# Patient Record
Sex: Male | Born: 2005 | Race: Black or African American | Hispanic: No | Marital: Single | Smoking: Never smoker
Health system: Southern US, Community
[De-identification: ages and names within clinical notes are randomized; demographics above are authoritative.]

## PROBLEM LIST (undated history)

## (undated) DIAGNOSIS — Q059 Spina bifida, unspecified: Secondary | ICD-10-CM

## (undated) DIAGNOSIS — N390 Urinary tract infection, site not specified: Secondary | ICD-10-CM

## (undated) DIAGNOSIS — K121 Other forms of stomatitis: Secondary | ICD-10-CM

## (undated) HISTORY — PX: MYRINGOTOMY: SUR874

## (undated) HISTORY — DX: Spina bifida, unspecified: Q05.9

## (undated) HISTORY — PX: OTHER SURGICAL HISTORY: SHX169

## (undated) HISTORY — PX: HIP SURGERY: SHX245

## (undated) HISTORY — PX: FOOT SURGERY: SHX648

## (undated) HISTORY — PX: VENTRICULOPERITONEAL SHUNT: SHX204

## (undated) HISTORY — PX: SHUNT EXTERNALIZATION: SHX341

## (undated) NOTE — *Deleted (*Deleted)
49 year old PMH spina bifida, prior LE wounds (Cx MSSA+), neurogenic bladder and bowel s/p ileocystoplasty w Mitrofanoff and MACE w indwelling Chait and Bladder neck sling, prior UTI, wheelchair bound and nonambulatory at baseline presenting with concern for recurrent lower extremity infections.  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   Work-up: CBC, CMP lactate, CK, coags normal. Blood culture pending without growth   Overnight, no complaints, met with WOCN blood blister - (xerofrom gauze BID)   Vitals appropriate  Eating and drinking well. IO cath every 3 hours.    Blisters: Blistering dactylitis versus pressure ulcer  - WOCN consult  - discahrge without antibotcs   FEN/GI: - Regular diet - Nightly enema through cecostomy for neurogenic bowel             -20cc glycerin, 40cc NS mixed in syringe and given before enema             -1L NS delivered via cecostomy from gravity bag  Neurogenic bladder - In and out cath [facility] through umbilical vesicostomy/Mitrofanoff with 12Fr catheter

---
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
## 2023-01-03 ENCOUNTER — Encounter (HOSPITAL_BASED_OUTPATIENT_CLINIC_OR_DEPARTMENT_OTHER): Payer: Medicaid Other | Attending: General Surgery | Admitting: General Surgery

## 2023-01-03 DIAGNOSIS — L89314 Pressure ulcer of right buttock, stage 4: Secondary | ICD-10-CM | POA: Diagnosis present

## 2023-01-03 DIAGNOSIS — N319 Neuromuscular dysfunction of bladder, unspecified: Secondary | ICD-10-CM | POA: Insufficient documentation

## 2023-01-03 DIAGNOSIS — Q052 Lumbar spina bifida with hydrocephalus: Secondary | ICD-10-CM | POA: Diagnosis not present

## 2023-01-03 DIAGNOSIS — K592 Neurogenic bowel, not elsewhere classified: Secondary | ICD-10-CM | POA: Diagnosis not present

## 2023-01-03 DIAGNOSIS — J4520 Mild intermittent asthma, uncomplicated: Secondary | ICD-10-CM | POA: Insufficient documentation

## 2023-01-03 NOTE — Progress Notes (Signed)
CLEAVEN, REUTHER ([id number]) [id number] Page 1 of 11 Visit Report for 01/03/2023 Chief Complaint Document Details Patient Name: Date of Service: Cory Mount RIO N L. 01/03/2023 10:30 A M Medical Record Number: [id number] Patient Account Number: [id number] Date of Birth/Sex: Treating RN: 2005-08-12 (17 y.o. M) Primary Care Provider: Edwina Barth Other Clinician: Referring Provider: Treating Provider/Extender: Eilene Ghazi in Treatment: 29 Information Obtained from: Patient Chief Complaint Patient is at the clinic for treatment of an open pressure ulcer Electronic Signature(s) Signed: 01/03/2023 11:09:13 AM By: Duanne Guess MD FACS Entered By: Duanne Guess on 01/03/2023 08:09:12 -------------------------------------------------------------------------------- Debridement Details Patient Name: Date of Service: [name], Cory RIO N L. 01/03/2023 10:30 A M Medical Record Number: [id number] Patient Account Number: [id number] Date of Birth/Sex: Treating RN: [birth year] (17 y.o. [name] Primary Care Provider: Edwina Barth Other Clinician: Referring Provider: Treating Provider/Extender: [name] Weeks in Treatment: 29 Debridement Performed for Assessment: Wound #1 Right Ischium Performed By: Physician Duanne Guess, MD Debridement Type: Debridement Level of Consciousness (Pre-procedure): Awake and Alert Pre-procedure Verification/Time Out Yes - 11:00 Taken: Start Time: 11:01 Percent of Wound Bed Debrided: 100% T Area Debrided (cm): otal 10.36 Tissue and other material debrided: Non-Viable, Slough, Slough Level: Non-Viable Tissue Debridement Description: Selective/Open Wound Instrument: Curette Specimen: Tissue Culture Number of Specimens T aken: 1 Bleeding: Minimum Hemostasis Achieved: Pressure Procedural Pain: 0 Post Procedural Pain: 0 Response to Treatment: Procedure  was tolerated well Level of Consciousness (Post- Awake and Alert procedure): Post Debridement Measurements of Total Wound Length: (cm) 4 Stage: Category/Stage IV Width: (cm) 3.3 Depth: (cm) 4 Volume: (cm) 41.469 Character of Wound/Ulcer Post DebridementRAYDEL, [name] ([id number]) [id number] Page 2 of 11 Post Procedure Diagnosis Same as Pre-procedure Notes Scribed for Dr Lady Gary by Zenaida Deed, RN Electronic Signature(s) Signed: 01/03/2023 11:17:29 AM By: Duanne Guess MD FACS Signed: 01/03/2023 5:11:31 PM By: Zenaida Deed RN, BSN Entered By: Zenaida Deed on 01/03/2023 08:07:48 -------------------------------------------------------------------------------- HPI Details Patient Name: Date of Service: [name], Cory RIO N L. 01/03/2023 10:30 A M Medical Record Number: [id number] Patient Account Number: [id number] Date of Birth/Sex: Treating RN: 2006-03-26 (17 y.o. 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 Around Christmas time, he developed a pressure ulcer on his right ischium. This seems to be related to the cushioning in his wheelchair. They have an appointment coming up on Wednesday to have this checked and addressed. For some reason he has been prescribed doxycycline and Flagyl and has a couple days left of this. They have just been covering the site with gauze. On his right ischial area, he has an oval wound that extends into the fat layer. There is some slough and fibrinous exudate present on the surface. There is no erythema, induration, malodor, or purulent drainage to suggest infection. 06/21/2022: The wound measured larger and  deeper today. There is evidence of pressure induced tissue injury and the surface is a bit dry with fibrinous exudate accumulation. He does not yet have a new cushion for his wheelchair. 06/30/2022: His wound is deeper again. The surface is very clean. They did obtain the eggcrate cushion, but did not cut out an area to offload his ulcer. 07/07/2022: His wound measured a little bit smaller  today. There is slough on the wound surface. For some reason they did not get the Iodosorb gel and have been using Iodosorb pads; I do not think these are doing as good a job of chemical debridement as the gel would. They did cut out the space in his eggcrate foam cushion to accommodate his wound and I think this is helpful. 07/15/2022: His wound is deeper today. The tissue is pale, but the wound is fairly clean. For reasons that remain unclear, his wheelchair cushion has not been replaced and his caregivers have not contacted [employer] to do anything about it. 07/20/2022: His wound is a little bit smaller. There is still a lot of undermining. He has more slough accumulation today. We ordered him a Roho cushion last week, but he has not yet received it. 3/13; patient presents for follow-up. He has been using Hydrofera Blue to the right ischium wound bed. He has no issues or complaints today. 08/05/2022: The color of the tissue in the wound bed has improved markedly. It is much more beefy and red, whereas previously it has been quite pale. There is some slough on the wound surface. He finally got his custom molded wheelchair cushion for his school chair and a Roho cushion for home. There was an error on the part of the DME company for his low-air-loss mattress bed, but this is in the process of being resolved. 08/26/2022: The wound is measuring a little deeper; it looks like some fat simply separated in the center of his wound, rather than worsening of the pressure injury. The quality of the tissue continues to improve. His  low-air-loss mattress was finally delivered. He has his wound VAC with him for application today. 09/02/2022: The wound continues to worsen. I can palpate his trochanter in the deepest part of the wound, although the bone is not exposed. There is a strong odor coming from the wound and the tissue surface is gray. 09/09/2022: The progression of the wound seems to have been arrested. The color is markedly improved and is now beefy red. There are still some areas of the tissue that are more purpleish, suggesting ongoing pressure induced injury. The odor has abated. The culture that I took produced a polymicrobial population including Proteus, Pseudomonas, and multiple other species. He is currently taking levofloxacin and metronidazole. 09/16/2022: The wound continues to improve. The color and is red and the surface is appropriately moist. He has been in his Roho cushion and I do not see any areas of further pressure induced injury. He is completing his course of oral levofloxacin and Flagyl. 09/23/2022: No pressure induced tissue damage this week. There is no odor coming from the wound. No significant changes to the wound dimensions, however. 09/29/2022: The wound size remains about the same. The tissues are looking a bit healthier. There is slough on the wound surface. 10/07/2022: The wound is looking better. The undermining at 12:00 has closed down. The tunneling that extends over the trochanter is still present, but is tighter and narrower. The tissues look healthier. There is an odor, however, on the dressing. 10/14/2022: There has been very nice improvement in the wound over the past week. The tunneling of the trochanter is shorter by a good half centimeter and CODIE, FINIGAN ([id number]) [id number] Page 3 of 11 the tissue is much tighter. Near 12:00 the wound is nearly flush with the surrounding skin surface. The odor has resolved. 10/25/2022: He had his wound VAC off for  graduation and there has  been some breakdown over the trochanter. The bone is not yet exposed, but the tissue is thinner. There is a layer of slough on the wound surface. 11/03/2022: There has been nice improvement since our last visit. The tissue over the trochanter has filled again and the space is contracting. The granulation tissue appears healthy and there is no slough. No evidence of ongoing pressure-induced tissue injury. 11/09/2022: The wound continues to improve. The undermined area is filling in more, but the tissue over the trochanter is still quite thin. There is minimal biofilm on the surface and no evidence of pressure induced tissue injury today. 11/16/2022: The undermined portion of the wound has filled in even more and the tissue over the trochanter is starting to get a bit thicker. Everything is extremely clean today without any odor or slough accumulation. 11/22/2022: The undermined area of the wound continues to contract. The wound is very clean. The tissue is robust and beefy red. 12/14/2022: The undermined area has come in by almost a cm. The tissue over the trochanter feels thicker. The wound is clean without any odor. 01/03/2023: The wound is stable, but there is some purpleish discoloration directly over the ischium consistent with pressure in this area. He has a little bit of slough accumulation around the edges of the wound. There is also an odor to the wound, despite use of topical gentamicin and mupirocin. Electronic Signature(s) Signed: 01/03/2023 11:10:29 AM By: Duanne Guess MD FACS Entered By: Duanne Guess on 01/03/2023 08:10:29 -------------------------------------------------------------------------------- Physical Exam Details Patient Name: Date of Service: [name], Cory RIO N L. 01/03/2023 10:30 A M Medical Record Number: [id number] Patient Account Number: [id number] Date of Birth/Sex: Treating RN: 2005/12/25 (16 y.o. M) Primary Care Provider: Edwina Barth  Other Clinician: Referring Provider: Treating Provider/Extender: [name] Weeks in Treatment: 29 Constitutional . . . . no acute distress. Respiratory Normal work of breathing on room air. Notes 01/03/2023: The wound is stable, but there is some purpleish discoloration directly over the ischium consistent with pressure in this area. He has a little bit of slough accumulation around the edges of the wound. There is also an odor to the wound. Electronic Signature(s) Signed: 01/03/2023 11:11:02 AM By: Duanne Guess MD FACS Entered By: Duanne Guess on 01/03/2023 08:11:02 -------------------------------------------------------------------------------- Physician Orders Details Patient Name: Date of Service: [name], Cory RIO N L. 01/03/2023 10:30 A M Medical Record Number: [id number] Patient Account Number: [id number] Date of Birth/Sex: Treating RN: 03-03-2006 (16 y.o. 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 - Dr. Lady Gary RM 1 Return A Wed. 9/4 @ 10:30 am Anesthetic Wound #1 Right Ischium (In clinic) Topical Lidocaine 4% applied to wound bed Bathing/ Shower/ Hygiene May shower and wash wound with soap and water. Negative Presssure Wound Therapy Medela Wound Vac continuously at 121mm/hg Black Foam Off-Loading Low air-loss mattress (Group 2) - ordered through [employer] Roho cushion for wheelchair - sent referral to [employer] Turn and reposition every 2 hours - use arms to lift up off the wheelchair at least hourly while up in wheelchair  Additional Orders / Instructions Follow Nutritious Diet - 70- 100 gms of protein per day. add protein drink such as Premeir Protein 2 times per day. Use the Juven for added protein intake. Wound Treatment Wound #1 - Ischium Wound Laterality: Right Cleanser: Soap and Water 3 x Per Week/15 Days Discharge Instructions: May shower and wash wound with dial antibacterial soap and water prior to dressing change. Cleanser: Vashe 5.8 (oz) 3 x Per Week/15 Days Discharge Instructions: Cleanse the wound with Vashe prior to applying a clean dressing using gauze sponges, not tissue or cotton balls. Peri-Wound Care: Skin Prep 3 x Per Week/15 Days Discharge Instructions: Use skin prep as directed Topical: Gentamicin 3 x Per Week/15 Days Discharge Instructions: thin layer to wound bed mixed with mupirocin Topical: Mupirocin Ointment 3 x Per Week/15 Days Discharge Instructions: Apply Mupirocin (Bactroban) as instructed Prim Dressing: Promogran Prisma Matrix, 4.34 (sq in) (silver collagen) 3 x Per Week/15 Days ary Discharge Instructions: Moisten collagen with saline or hydrogel Prim Dressing: NPWT ary 3 x Per Week/15 Days Consults Plastic Surgery - [facility] for evaluation to flap for stage 4 pressure ulcer to right ischium Laboratory naerobe culture (MICRO) Bacteria identified in Unspecified specimen by A LOINC Code: 635-3 Convenience Name: Anaerobic culture Electronic Signature(s) Signed: 01/03/2023 11:17:29 AM By: Duanne Guess MD FACS Signed: 01/03/2023 5:11:31 PM By: Zenaida Deed RN, BSN Previous Signature: 01/03/2023 11:11:45 AM Version By: Duanne Guess MD FACS Entered By: Zenaida Deed on 01/03/2023 08:15:20 [name] ([id number]) [id number] Page 5 of 11 Prescription  01/03/2023 -------------------------------------------------------------------------------- [name] Duanne Guess MD Patient Name: Provider: Nov 12, 2005 [id number] Date of Birth: NPI#: Judie Petit [id number] Sex: DEA #: [phone] 2012 Phone #: License #: UPN: Patient Address: [address] Eligha Bridegroom [facility] Wound [place], [place] [address] [address] [place], [place] [address] [phone] Allergies latex Provider's Orders Roho cushion for wheelchair - sent referral to [employer] Hand Signature: Date(s): Prescription 01/03/2023 [name] Duanne Guess MD Patient Name: Provider: [birth year] [id number] Date of Birth: NPI#: Judie Petit [id number] Sex: DEA #: 2012 Phone #: License #: UPN: Patient Address: [address] Eligha Bridegroom [facility] Wound [place], [place] [address] [place], [place] [address] [phone] Allergies latex Provider's Orders Plastic Surgery - [facility] for evaluation to flap for stage 4 pressure ulcer to right ischium Hand Signature: Date(s): Electronic Signature(s) Signed: 01/03/2023 11:16:50 AM By: Duanne Guess MD FACS Entered By: Duanne Guess on 01/03/2023 08:16:49 -------------------------------------------------------------------------------- Problem List Details Patient Name: Date of Service: [name], Cory RIO N L. 01/03/2023 10:30 A M Medical Record Number: [id number] Patient Account Number: [id number] Date of Birth/Sex: Treating RN: [birth year] (16 y.o. 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 01/03/2023 10:30  A M Medical Record Number: [id number] Patient Account Number: [id number] Date of Birth/Sex: Treating RN: 14-Apr-2006 (17 y.o. 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 Around Christmas time, he developed a pressure ulcer on his right ischium. This seems to be related to the cushioning in his wheelchair. They have an appointment coming up on Wednesday to have this checked and addressed. For some reason he has been prescribed doxycycline and Flagyl and has a couple days left of this. They have just been covering the site with gauze. On his right ischial area, he has an oval wound that extends into the fat layer. There is some slough and fibrinous exudate present on the surface. There is no erythema, induration, malodor, or purulent drainage to suggest infection. 06/21/2022: The wound measured larger and deeper today. There is  evidence of pressure induced tissue injury and the surface is a bit dry with fibrinous exudate accumulation. He does not yet have a new cushion for his wheelchair. 06/30/2022: His wound is deeper again. The surface is very clean. They did obtain the eggcrate cushion, but did not cut out an area to offload his ulcer. 07/07/2022: His wound measured a little bit smaller today. There is slough on the wound surface. For some reason they did not get the Iodosorb gel and have been using Iodosorb pads; I do not think these are doing as good a job of chemical debridement as the gel would. They did cut out the space in his eggcrate foam cushion to accommodate his wound and I think this is helpful. [name], [name] ([id number]) [id number] Page 7 of 11 07/15/2022: His wound is deeper today. The tissue is pale, but the wound is fairly clean. For reasons that remain unclear, his wheelchair cushion has not been replaced and his caregivers have not contacted [employer] to do anything about it. 07/20/2022: His wound is a little bit smaller. There is still a lot of undermining. He has more slough accumulation today. We ordered him a Roho cushion last week, but he has not yet received it. 3/13; patient presents for follow-up. He has been using Hydrofera Blue to the right ischium wound bed. He has no issues or complaints today. 08/05/2022: The color of the tissue in the wound bed has improved markedly. It is much more beefy and red, whereas previously it has been quite pale. There is some slough on the wound surface. He finally got his custom molded wheelchair cushion for his school chair and a Roho cushion for home. There was an error on the part of the DME company for his low-air-loss mattress bed, but this is in the process of being resolved. 08/26/2022: The wound is measuring a little deeper; it looks like some fat simply separated in the center of his wound, rather than worsening of the  pressure injury. The quality of the tissue continues to improve. His low-air-loss mattress was finally delivered. He has his wound VAC with him for application today. 09/02/2022: The wound continues to worsen. I can palpate his trochanter in the deepest part of the wound, although the bone is not exposed. There is a strong odor coming from the wound and the tissue  surface is gray. 09/09/2022: The progression of the wound seems to have been arrested. The color is markedly improved and is now beefy red. There are still some areas of the tissue that are more purpleish, suggesting ongoing pressure induced injury. The odor has abated. The culture that I took produced a polymicrobial population including Proteus, Pseudomonas, and multiple other species. He is currently taking levofloxacin and metronidazole. 09/16/2022: The wound continues to improve. The color and is red and the surface is appropriately moist. He has been in his Roho cushion and I do not see any areas of further pressure induced injury. He is completing his course of oral levofloxacin and Flagyl. 09/23/2022: No pressure induced tissue damage this week. There is no odor coming from the wound. No significant changes to the wound dimensions, however. 09/29/2022: The wound size remains about the same. The tissues are looking a bit healthier. There is slough on the wound surface. 10/07/2022: The wound is looking better. The undermining at 12:00 has closed down. The tunneling that extends over the trochanter is still present, but is tighter and narrower. The tissues look healthier. There is an odor, however, on the dressing. 10/14/2022: There has been very nice improvement in the wound over the past week. The tunneling of the trochanter is shorter by a good half centimeter and the tissue is much tighter. Near 12:00 the wound is nearly flush with the surrounding skin surface. The odor has resolved. 10/25/2022: He had his wound VAC off for graduation and  there has been some breakdown over the trochanter. The bone is not yet exposed, but the tissue is thinner. There is a layer of slough on the wound surface. 11/03/2022: There has been nice improvement since our last visit. The tissue over the trochanter has filled again and the space is contracting. The granulation tissue appears healthy and there is no slough. No evidence of ongoing pressure-induced tissue injury. 11/09/2022: The wound continues to improve. The undermined area is filling in more, but the tissue over the trochanter is still quite thin. There is minimal biofilm on the surface and no evidence of pressure induced tissue injury today. 11/16/2022: The undermined portion of the wound has filled in even more and the tissue over the trochanter is starting to get a bit thicker. Everything is extremely clean today without any odor or slough accumulation. 11/22/2022: The undermined area of the wound continues to contract. The wound is very clean. The tissue is robust and beefy red. 12/14/2022: The undermined area has come in by almost a cm. The tissue over the trochanter feels thicker. The wound is clean without any odor. 01/03/2023: The wound is stable, but there is some purpleish discoloration directly over the ischium consistent with pressure in this area. He has a little bit of slough accumulation around the edges of the wound. There is also an odor to the wound, despite use of topical gentamicin and mupirocin. Patient History Information obtained from Caregiver, Chart. Family History Kidney Disease - Father, No family history of Cancer, Diabetes, Heart Disease, Hereditary Spherocytosis, Hypertension, Lung Disease, Seizures, Stroke, Thyroid Problems, Tuberculosis. Social History Never smoker, Marital Status - Single, Alcohol Use - Never, Drug Use - No History, Caffeine Use - Moderate. Medical History Eyes Denies history of Cataracts, Glaucoma, Optic Neuritis Ear/Nose/Mouth/Throat Denies  history of Chronic sinus problems/congestion, Middle ear problems Respiratory Patient has history of Asthma - mild Endocrine Denies history of Type I Diabetes, Type II Diabetes Genitourinary Denies history of End Stage Renal Disease Integumentary (  Skin) Denies history of History of Burn Neurologic Patient has history of Paraplegia Oncologic Denies history of Received Chemotherapy, Received Radiation Psychiatric Denies history of Anorexia/bulimia, Confinement Anxiety Hospitalization/Surgery History - myringotomy. - hip surgery. - ventriculoperitoneal shunt. Medical A Surgical History Notes nd Ear/Nose/Mouth/Throat allergic rhinitis Gastrointestinal [name], [name] ([id number]) [id number] Page 8 of 11 ostomy, bowel program Genitourinary neurogenic bladder Integumentary (Skin) eczema Musculoskeletal contracture of left hip, spinabifida Neurologic developmental delay, loloprosencephaly, hydrocephalus Objective Constitutional no acute distress. Vitals Time Taken: 10:42 AM, Height: 60 in, Weight: 110 lbs, BMI: 21.5, Temperature: 98.4 F, Pulse: 68 bpm, Respiratory Rate: 18 breaths/min, Blood Pressure: 104/62 mmHg. Respiratory Normal work of breathing on room air. General Notes: 01/03/2023: The wound is stable, but there is some purpleish discoloration directly over the ischium consistent with pressure in this area. He has a little bit of slough accumulation around the edges of the wound. There is also an odor to the wound. Integumentary (Hair, Skin) Wound #1 status is Open. Original cause of wound was Pressure Injury. The date acquired was: 05/11/2022. The wound has been in treatment 29 weeks. The wound is located on the Right Ischium. The wound measures 4cm length x 3.3cm width x 2cm depth; 10.367cm^2 area and 20.735cm^3 volume. There is Fat Layer (Subcutaneous Tissue) exposed. There is undermining starting at 2:00 and ending at 6:00 with a maximum  distance of 3.4cm. There is a medium amount of serosanguineous drainage noted. The wound margin is well defined and not attached to the wound base. There is large (67-100%) red granulation within the wound bed. There is a small (1-33%) amount of necrotic tissue within the wound bed including Adherent Slough. The periwound skin appearance had no abnormalities noted for texture. The periwound skin appearance had no abnormalities noted for moisture. The periwound skin appearance had no abnormalities noted for color. Periwound temperature was noted as No Abnormality. 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 There was a Selective/Open Wound Non-Viable Tissue Debridement with a total area of 10.36 sq cm performed by Duanne Guess, MD. With the following instrument(s): Curette to remove Non-Viable tissue/material. Material removed includes [facility]. 1 specimen was taken by a Tissue Culture and sent to the lab per facility protocol. A time out was conducted at 11:00, prior to the start of the procedure. A Minimum amount of bleeding was controlled with Pressure. The procedure was tolerated well with a pain level of 0 throughout and a pain level of 0 following the procedure. Post Debridement Measurements: 4cm length x 3.3cm width x 4cm depth; 41.469cm^3 volume. Post debridement Stage noted as Category/Stage IV. Character of Wound/Ulcer Post Debridement is stable. Post procedure Diagnosis Wound #1: Same as Pre-Procedure General Notes: Scribed for Dr Lady Gary by Zenaida Deed, RN. Plan 01/03/2023: The wound is stable, but there is some purpleish discoloration directly over the ischium consistent  with pressure in this area. He has a little bit of slough accumulation around the edges of the wound. There is also an odor to the wound, despite use of topical gentamicin and mupirocin. [name], [name] ([id number]) [id number] Page 9 of 11 I used a curette to debride slough from a portion of the wound. I then took a culture, due to the increased odor. For now, while I await culture  data, we will continue the topical gentamicin and mupirocin with Prisma silver collagen over the trochanter and continue negative pressure wound therapy. He did recently see orthopedics at [facility] regarding his scoliosis. Apparently his current seating cushion bottoms out with current pressure and his ischium is in contact with the metal stays of his wheelchair. His scoliosis is also contributing to the pressure on his right ischium and the orthopedic surgeon suggested the possibility of plastic surgery intervention for tissue coverage, as they are considering surgical intervention for the scoliosis, but cannot do so until the wound heals. We will make a referral to the [facility] plastic surgery group for them to further discuss this. Follow-up here in 2 weeks. Electronic Signature(s) Signed: 01/03/2023 11:16:09 AM By: Duanne Guess MD FACS Entered By: Duanne Guess on 01/03/2023 08:16:08 -------------------------------------------------------------------------------- HxROS Details Patient Name: Date of Service: [name], Cory RIO N L. 01/03/2023 10:30 A M Medical Record Number: [id number] Patient Account Number: [id number] Date of Birth/Sex: Treating RN: [birth year] (16 y.o. M) Primary Care Provider: Edwina Barth Other Clinician: Referring Provider: Treating Provider/Extender: Eilene Ghazi in Treatment: 29 Information Obtained From Caregiver Chart Eyes Medical History: Negative for: Cataracts; Glaucoma; Optic  Neuritis Ear/Nose/Mouth/Throat Medical History: Negative for: Chronic sinus problems/congestion; Middle ear problems Past Medical History Notes: allergic rhinitis Respiratory Medical History: Positive for: Asthma - mild Gastrointestinal Medical History: Past Medical History Notes: ostomy, bowel program Endocrine Medical History: Negative for: Type I Diabetes; Type II Diabetes Genitourinary Medical History: Negative for: End Stage Renal Disease Past Medical History Notes: neurogenic bladder Integumentary (Skin) Medical History: Negative for: History of Burn Past Medical History Notes: eczema [name], [name] ([id number]) [id number] Page 10 of 11 Musculoskeletal Medical History: Past Medical History Notes: contracture of left hip, spinabifida Neurologic Medical History: 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, loloprosencephaly, hydrocephalus Oncologic Medical History: Negative for: Received Chemotherapy; Received Radiation Psychiatric Medical History: Negative for: Anorexia/bulimia; Confinement Anxiety Immunizations Pneumococcal Vaccine: Received Pneumococcal Vaccination: No Implantable Devices No devices added Hospitalization / Surgery History Type of Hospitalization/Surgery myringotomy hip surgery ventriculoperitoneal shunt Family and Social History Cancer: No; Diabetes: No; Heart Disease: No; Hereditary Spherocytosis: No; Hypertension: No; Kidney Disease: Yes - Father; Lung Disease: No; Seizures: No; Stroke: No; Thyroid Problems: No; Tuberculosis: No; Never smoker; Marital Status - Single; Alcohol Use: Never; Drug Use: No History; Caffeine Use: Moderate; Financial Concerns: No; Food, Clothing or Shelter Needs: No; Support System Lacking: No; Transportation Concerns: No Electronic Signature(s) Signed: 01/03/2023 11:17:29 AM By: Duanne Guess MD FACS Entered By: Duanne Guess on 01/03/2023  08:10:34 -------------------------------------------------------------------------------- SuperBill Details Patient Name: Date of Service: [name], Cory RIO N L. 01/03/2023 Medical Record Number: [id number] Patient Account Number: [id number] Date of Birth/Sex: Treating RN: [birth year] (16 y.o. M) Primary Care Provider: Edwina Barth Other Clinician: Referring Provider: Treating Provider/Extender: [name] Weeks in Treatment: 29 Diagnosis Coding ICD-10 Codes Code Description L89.314 Pressure ulcer of right buttock, stage 4 Q05.2 Lumbar spina bifida with hydrocephalus K59.2 Neurogenic bowel, not elsewhere classified N31.9 Neuromuscular dysfunction of bladder, unspecified J45.20 Mild intermittent asthma, uncomplicated Facility Procedures [name], [name] ([id number]): CPT4 Code Description [id number] [phone] - DEBRIDE WOUND 1ST 20 SQ CM OR < ICD-10 Diagnosis Description L89.314 Pressure ulcer of right buttock, stage 4 [id number] Page 11 of 11: Modifier Quantity 1 Physician Procedures : CPT4 Code Description Modifier [id number] - WC PHYS LEVEL 4 - EST PT 25 ICD-10 Diagnosis Description L89.314 Pressure ulcer of right  buttock, stage 4 Q05.2 Lumbar spina bifida with hydrocephalus K59.2 Neurogenic bowel, not elsewhere classified N31.9  Neuromuscular dysfunction of bladder, unspecified Quantity: 1 : [id number] - WC PHYS DEBR WO ANESTH 20 SQ CM ICD-10 Diagnosis Description L89.314 Pressure ulcer of right buttock, stage 4 Quantity: 1 Electronic Signature(s) Signed: 01/03/2023 11:16:28 AM By: Duanne Guess MD FACS Entered By: Duanne Guess on 01/03/2023 36:64:40

## 2023-01-03 NOTE — Progress Notes (Signed)
JC, [name] ([id number]) [id number] Page 1 of 8 Visit Report for 01/03/2023 Arrival Information Details Patient Name: Date of Service: Cory Mount RIO N L. 01/03/2023 10:30 A M Medical Record Number: [id number] Patient Account Number: [id number] Date of Birth/Sex: Treating RN: [name] (16 y.o. [name] Primary Care [name]: [name] Other Clinician: Referring [name]: Treating [name]/Extender: [name] in Treatment: 29 Visit Information History Since Last Visit Added or deleted any medications: No Patient Arrived: Wheel Chair Any new allergies or adverse reactions: No Arrival Time: 10:41 Had a fall or experienced change in No Accompanied By: grandmother activities of daily living that may affect Transfer Assistance: None risk of falls: Patient Identification Verified: Yes Signs or symptoms of abuse/neglect since last visito No Secondary Verification Process Completed: Yes Hospitalized since last visit: No Patient Requires Transmission-Based Precautions: No Implantable device outside of the clinic excluding No Patient Has Alerts: No cellular tissue based products placed in the center since last visit: Has Dressing in Place as Prescribed: Yes Pain Present Now: No Electronic Signature(s) Signed: 01/03/2023 5:11:31 PM By: Cory Deed RN, BSN Entered By: [name] on 01/03/2023 Stark:42:23 -------------------------------------------------------------------------------- Encounter Discharge Information Details Patient Name: Date of Service: [name], Cory RIO N L. 01/03/2023 10:30 A M Medical Record Number: [id number] Patient Account Number: [id number] Date of Birth/Sex: Treating RN: [birth year] (16 y.o. [name] Primary Care [name]: [name] Other Clinician: Referring Nika Yazzie: Treating [name]/Extender: [name] in Treatment:  29 Encounter Discharge Information Items Post Procedure Vitals Discharge Condition: Stable Temperature (F): 98.4 Ambulatory Status: Wheelchair Pulse (bpm): 68 Discharge Destination: Home Respiratory Rate (breaths/min): 18 Transportation: Private Auto Blood Pressure (mmHg): 104/62 Accompanied By: granddaughter Schedule Follow-up Appointment: Yes Clinical Summary of Care: Patient Declined Electronic Signature(s) Signed: 01/03/2023 5:11:31 PM By: Cory Deed RN, BSN Entered By: [name] on 01/03/2023 08:32:43 [name] ([id number]) [id number] Page 2 of 8 -------------------------------------------------------------------------------- Lower Extremity Assessment Details Patient Name: Date of Service: Cory Mount RIO N L. 01/03/2023 10:30 A M Medical Record Number: [id number] Patient Account Number: [id number] Date of Birth/Sex: Treating RN: [name], [name] (16 y.o. [name] Primary Care [name]: [name] Other Clinician: Referring [name]: Treating [name]/Extender: [name] Weeks in Treatment: 29 Electronic Signature(s) Signed: 01/03/2023 5:11:31 PM By: Cory Deed RN, BSN Entered By: [name] on 01/03/2023 Stark:43:23 -------------------------------------------------------------------------------- Multi Wound Chart Details Patient Name: Date of Service: [name], Cory RIO N L. 01/03/2023 10:30 A M Medical Record Number: [id number] Patient Account Number: [id number] Date of Birth/Sex: Treating RN: [name], [name] (16 y.o. M) Primary Care [name]: [name] Other Clinician: Referring [name]: Treating [name]/Extender: [name] Weeks in Treatment: 29 Vital Signs Height(in): 60 Pulse(bpm): 68 Weight(lbs): 110 Blood Pressure(mmHg): 104/62 Body Mass Index(BMI): 21.5 Temperature(F): 98.4 Respiratory Rate(breaths/min): 18 [1:Photos:] [N/A:N/A] Right Ischium  N/A N/A Wound Location: Pressure Injury N/A N/A Wounding Event: Pressure Ulcer N/A N/A Primary Etiology: Asthma, Paraplegia N/A N/A Comorbid History: Stark/26/2023 N/A N/A Date Acquired: 36 N/A N/A Weeks of Treatment: Open N/A N/A Wound Status: No N/A N/A Wound Recurrence: 4x3.3x2 N/A N/A Measurements L x W x D (cm) 10.367 N/A N/A A (cm) : rea Stark.735 N/A N/A Volume (cm) : -61.Stark% N/A N/A % Reduction in A rea: -544.70% N/A N/A % Reduction in Volume: 2 Starting Position 1 (o'clock): 6 Ending Position 1 (o'clock): 3.4 Maximum Distance 1 (cm): Yes N/A N/A Undermining: Category/Stage IV N/A N/A Classification: Medium N/A N/A  Exudate A mount: Serosanguineous N/A N/A Exudate Type: red, brown N/A N/A Exudate Color: Well defined, not attached N/A N/A Wound MarginKALEEL, Stark ([id number]) [id number] Page 3 of 8 Large (67-100%) N/A N/A Granulation Amount: Red N/A N/A Granulation Quality: Small (1-33%) N/A N/A Necrotic Amount: Fat Layer (Subcutaneous Tissue): Yes N/A N/A Exposed Structures: Fascia: No Tendon: No Muscle: No Joint: No Bone: No None N/A N/A Epithelialization: Debridement - Selective/Open Wound N/A N/A Debridement: Pre-procedure Verification/Time Out 11:00 N/A N/A Taken: Slough N/A N/A Tissue Debrided: Non-Viable Tissue N/A N/A Level: 10.36 N/A N/A Debridement A (sq cm): rea Curette N/A N/A Instrument: Minimum N/A N/A Bleeding: Pressure N/A N/A Hemostasis A chieved: 0 N/A N/A Procedural Pain: 0 N/A N/A Post Procedural Pain: Procedure was tolerated well N/A N/A Debridement Treatment Response: 4x3.3x4 N/A N/A Post Debridement Measurements L x W x D (cm) 41.469 N/A N/A Post Debridement Volume: (cm) Category/Stage IV N/A N/A Post Debridement Stage: No Abnormalities Noted N/A N/A Periwound Skin Texture: No Abnormalities Noted N/A N/A Periwound Skin Moisture: No Abnormalities Noted N/A  N/A Periwound Skin Color: No Abnormality N/A N/A Temperature: Debridement N/A N/A Procedures Performed: Treatment Notes Electronic Signature(s) Signed: 01/03/2023 11:09:02 AM By: Cory Guess MD [name] Entered By: [name] on 01/03/2023 08:09:02 -------------------------------------------------------------------------------- Multi-Disciplinary Care Plan Details Patient Name: Date of Service: [name], Cory RIO N L. 01/03/2023 10:30 A M Medical Record Number: [id number] Patient Account Number: [id number] Date of Birth/Sex: Treating RN: [birth year] (16 y.o. [name] Primary Care Deloris Mittag: [name] Other Clinician: Referring Fantashia Shupert: Treating Deamber Buckhalter/Extender: [name] Weeks in Treatment: 29 Multidisciplinary Care Plan reviewed with physician Active Inactive Pressure Nursing Diagnoses: Knowledge deficit related to causes and risk factors for pressure ulcer development Knowledge deficit related to management of pressures ulcers Potential for impaired tissue integrity related to pressure, friction, moisture, and shear Goals: Patient/caregiver will verbalize understanding of pressure ulcer management Date Initiated: 06/14/2022 Target Resolution Date: 02/01/2023 Goal Status: Active Interventions: Assess: immobility, friction, shearing, incontinence upon admission and as needed Assess offloading mechanisms upon admission and as needed Assess potential for pressure ulcer upon admission and as needed Treatment Activities: [name], [name] ([id number]) [phone].pdf Page 4 of 8 Patient referred for seating evaluation to ensure proper offloading : 06/14/2022 Pressure reduction/relief device ordered : 06/14/2022 Notes: Wound/Skin Impairment Nursing Diagnoses: Impaired tissue integrity Knowledge deficit related to ulceration/compromised skin integrity Goals: Patient/caregiver will verbalize understanding of skin  care regimen Date Initiated: 06/14/2022 Target Resolution Date: 01/31/2023 Goal Status: Active Ulcer/skin breakdown will have a volume reduction of 30% by week 4 Date Initiated: 06/14/2022 Date Inactivated: 07/15/2022 Target Resolution Date: 09/14/2022 Unmet Reason: requires new w/c Goal Status: Unmet cushion Ulcer/skin breakdown will have a volume reduction of 50% by week 8 Date Initiated: 07/15/2022 Date Inactivated: 09/16/2022 Target Resolution Date: 08/12/2022 Goal Status: Unmet Unmet Reason: infection Interventions: Assess patient/caregiver ability to obtain necessary supplies Assess patient/caregiver ability to perform ulcer/skin care regimen upon admission and as needed Assess ulceration(s) every visit Provide education on ulcer and skin care Treatment Activities: Skin care regimen initiated : 06/14/2022 Topical wound management initiated : 06/14/2022 Notes: Electronic Signature(s) Signed: 01/03/2023 5:11:31 PM By: Cory Deed RN, BSN Entered By: [name] on 01/03/2023 Stark:55:08 -------------------------------------------------------------------------------- Negative Pressure Wound Therapy Maintenance (NPWT) Details Patient Name: Date of Service: Cory Mount RIO N L. 01/03/2023 10:30 A M Medical Record Number: [id number] Patient Account Number: [id number] Date of Birth/Sex: Treating RN: [name][birth year] (16 y.o. [name] Primary Care  Tsugio Elison: [name] Other Clinician: Referring Lakresha Stifter: Treating Maryam Feely/Extender: [name] in Treatment: 29 NPWT Maintenance Performed for: Wound #1 Right Ischium Performed By: Cory Deed, RN Type: Medela [employer] Coverage Size (sq cm): 13.2 Pressure Type: Constant Pressure Setting: 125 mmHG Drain Type: None Primary Contact: Other : silver collagen Sponge/Dressing Type: Foam- Black Date Initiated: 08/26/2022 Dressing Removed: Yes Quantity of Sponges/Gauze Removed: 1 Canister  Changed: Yes Canister Exudate Volume: 200 Dressing Reapplied: Yes Quantity of Sponges/Gauze Inserted: 1 Respones T Treatment: o good Days On NPWT : 131 Post Procedure Diagnosis Same as [name], [name] ([id number]) [id number] Page 5 of 8 Electronic Signature(s) Signed: 01/03/2023 5:11:31 PM By: Cory Deed RN, BSN Entered By: [name] on 01/03/2023 08:30:58 -------------------------------------------------------------------------------- Pain Assessment Details Patient Name: Date of Service: [name], Ray Church RIO N L. 01/03/2023 10:30 A M Medical Record Number: [id number] Patient Account Number: [id number] Date of Birth/Sex: Treating RN: [name] (16 y.o. [name] Primary Care Grete Bosko: [name] Other Clinician: Referring Jowanna Loeffler: Treating Kayler Buckholtz/Extender: [name] Weeks in Treatment: 29 Active Problems Location of Pain Severity and Description of Pain Patient Has Paino No Site Locations Rate the pain. Current Pain Level: 0 Pain Management and Medication Current Pain Management: Electronic Signature(s) Signed: 01/03/2023 5:11:31 PM By: Cory Deed RN, BSN Entered By: [name] on 01/03/2023 Stark:43:14 -------------------------------------------------------------------------------- Patient/Caregiver Education Details Patient Name: Date of Service: Cory Mount RIO [name] 8/19/2024andnbsp10:30 A M Medical Record Number: [id number] Patient Account Number: [id number] Date of Birth/Gender: Treating RN: [name], [name] (16 y.o. [name] Primary Care Physician: [name] Other Clinician: Referring Physician: Treating Physician/Extender: [name] in Treatment: [address] JESUS, LAPENTA ([id number]) [id number] Page 6 of 8 Education Provided To: Patient Education Topics  Provided Pressure: Methods: Explain/Verbal Responses: Reinforcements needed, State content correctly Wound/Skin Impairment: Methods: Explain/Verbal Responses: Reinforcements needed, State content correctly Electronic Signature(s) Signed: 01/03/2023 5:11:31 PM By: Cory Deed RN, BSN Entered By: [name] on 01/03/2023 Stark:55:32 -------------------------------------------------------------------------------- Wound Assessment Details Patient Name: Date of Service: [name], Cory RIO N L. 01/03/2023 10:30 A M Medical Record Number: [id number] Patient Account Number: [id number] Date of Birth/Sex: Treating RN: [name] (16 y.o. [name] Primary Care Jeane Cashatt: [name] Other Clinician: Referring Daphnee Preiss: Treating Dara Camargo/Extender: [name] Weeks in Treatment: 29 Wound Status Wound Number: 1 Primary Etiology: Pressure Ulcer Wound Location: Right Ischium Wound Status: Open Wounding Event: Pressure Injury Comorbid History: Asthma, Paraplegia Date Acquired: Stark/26/2023 Weeks Of Treatment: 29 Clustered Wound: No Photos Wound Measurements Length: (cm) 4 Width: (cm) 3.3 Depth: (cm) 2 Area: (cm) 10.367 Volume: (cm) Stark.735 % Reduction in Area: -61.2% % Reduction in Volume: -544.7% Epithelialization: None Undermining: Yes Starting Position (o'clock): 2 Ending Position (o'clock): 6 Maximum Distance: (cm) 3.4 Wound Description Classification: Category/Stage IV Wound Margin: Well defined, not attached Exudate Amount: Medium Exudate Type: Serosanguineous Exudate Color: red, brown Clerk, Haddon L ([id number]) Wound Bed Granulation Amount: Large (67-1 Granulation Quality: Red Necrotic Amount: Small (1-33 Necrotic Quality: Adherent Sl Foul Odor After Cleansing: No Slough/Fibrino No [id number] Page 7 of 8 00%) Exposed Structure Fascia Exposed: No %) Fat Layer (Subcutaneous Tissue) Exposed: Yes ough  Tendon Exposed: No Muscle Exposed: No Joint Exposed: No Bone Exposed: No Periwound Skin Texture Texture Color No Abnormalities Noted: Yes No Abnormalities Noted: Yes Moisture Temperature / Pain No Abnormalities Noted: Yes Temperature: No Abnormality Treatment Notes Wound #1 (Ischium) Wound Laterality: Right Cleanser Soap and Water  Discharge Instruction: May shower and wash wound with dial antibacterial soap and water prior to dressing change. Vashe 5.8 (oz) Discharge Instruction: Cleanse the wound with Vashe prior to applying a clean dressing using gauze sponges, not tissue or cotton balls. Peri-Wound Care Skin Prep Discharge Instruction: Use skin prep as directed Topical Gentamicin Discharge Instruction: thin layer to wound bed mixed with mupirocin Mupirocin Ointment Discharge Instruction: Apply Mupirocin (Bactroban) as instructed Primary Dressing Promogran Prisma Matrix, 4.34 (sq in) (silver collagen) Discharge Instruction: Moisten collagen with saline or hydrogel NPWT Secondary Dressing Secured With Compression Wrap Compression Stockings Add-Ons Electronic Signature(s) Signed: 01/03/2023 5:11:31 PM By: Cory Deed RN, BSN Entered By: [name] on 01/03/2023 Stark:53:56 -------------------------------------------------------------------------------- Vitals Details Patient Name: Date of Service: [name], Cory RIO N L. 01/03/2023 10:30 A M Medical Record Number: [id number] Patient Account Number: [id number] Date of Birth/Sex: Treating RN: [name] (16 y.o. [name] Primary Care Amaris Garrette: [name] Other Clinician: Referring Allena Pietila: Treating Sheritha Louis/Extender: [name] Weeks in Treatment: 29 Vital Signs Time Taken: 10:42 Temperature (F): 98.4 SARAN, SCHUENEMAN ([id number]) [id number].pdf Page 8 of 8 Height (in): 60 Pulse (bpm): 68 Weight (lbs): 110 Respiratory Rate (breaths/min): 18 Body  Mass Index (BMI): 21.5 Blood Pressure (mmHg): 104/62 Reference Range: 80 - 120 mg / dl Electronic Signature(s) Signed: 01/03/2023 5:11:31 PM By: Cory Deed RN, BSN Entered By: [name] on 01/03/2023 Stark:43:05

## 2023-01-05 ENCOUNTER — Ambulatory Visit (HOSPITAL_COMMUNITY)
Admission: RE | Admit: 2023-01-05 | Discharge: 2023-01-05 | Disposition: A | Discharge status: Home / Self Care | Payer: Medicaid Other | Source: Ambulatory Visit | Attending: General Surgery | Admitting: General Surgery

## 2023-01-05 ENCOUNTER — Other Ambulatory Visit (HOSPITAL_COMMUNITY): Payer: Self-pay | Admitting: *Deleted

## 2023-01-05 ENCOUNTER — Other Ambulatory Visit (HOSPITAL_COMMUNITY)
Admission: RE | Admit: 2023-01-05 | Discharge: 2023-01-05 | Disposition: A | Discharge status: Home / Self Care | Payer: Medicaid Other | Source: Ambulatory Visit | Attending: General Surgery | Admitting: General Surgery

## 2023-01-05 DIAGNOSIS — L89314 Pressure ulcer of right buttock, stage 4: Secondary | ICD-10-CM | POA: Diagnosis present

## 2023-01-05 LAB — ALBUMIN: Albumin: 3.8 g/dL (ref 3.5–5.0)

## 2023-01-06 ENCOUNTER — Other Ambulatory Visit (HOSPITAL_COMMUNITY): Payer: Self-pay | Admitting: General Surgery

## 2023-01-06 DIAGNOSIS — L89314 Pressure ulcer of right buttock, stage 4: Secondary | ICD-10-CM

## 2023-01-07 ENCOUNTER — Ambulatory Visit (HOSPITAL_COMMUNITY)
Admission: RE | Admit: 2023-01-07 | Discharge: 2023-01-07 | Disposition: A | Discharge status: Home / Self Care | Payer: Medicaid Other | Source: Ambulatory Visit | Attending: General Surgery | Admitting: General Surgery

## 2023-01-07 DIAGNOSIS — L89314 Pressure ulcer of right buttock, stage 4: Secondary | ICD-10-CM | POA: Insufficient documentation

## 2023-01-07 MED ORDER — GADOBUTROL 1 MMOL/ML IV SOLN
5.0000 mL | Freq: Once | INTRAVENOUS | Status: AC | PRN
  Administered 2023-01-07: 5 mL via INTRAVENOUS

## 2023-01-19 ENCOUNTER — Encounter (HOSPITAL_BASED_OUTPATIENT_CLINIC_OR_DEPARTMENT_OTHER): Payer: Medicaid Other | Attending: General Surgery | Admitting: General Surgery

## 2023-01-19 DIAGNOSIS — L89314 Pressure ulcer of right buttock, stage 4: Secondary | ICD-10-CM | POA: Insufficient documentation

## 2023-01-19 DIAGNOSIS — K592 Neurogenic bowel, not elsewhere classified: Secondary | ICD-10-CM | POA: Diagnosis not present

## 2023-01-19 DIAGNOSIS — Q052 Lumbar spina bifida with hydrocephalus: Secondary | ICD-10-CM | POA: Insufficient documentation

## 2023-01-19 DIAGNOSIS — N319 Neuromuscular dysfunction of bladder, unspecified: Secondary | ICD-10-CM | POA: Diagnosis not present

## 2023-01-19 DIAGNOSIS — M86152 Other acute osteomyelitis, left femur: Secondary | ICD-10-CM | POA: Diagnosis not present

## 2023-01-20 NOTE — Progress Notes (Signed)
NIEEM, RAYFORD ([id number]) [id number] Page 1 of 8 Visit Report for 01/19/2023 Arrival Information Details Patient Name: Date of Service: [name] 01/19/2023 10:30 A M Medical Record Number: [id number] Patient Account Number: [id number] Date of Birth/Sex: Treating RN: [birth year] (16 y.o. M) Primary Care Ellena Kamen: Edwina Barth Other Clinician: Referring Tore Carreker: Treating Alanea Woolridge/Extender: Eilene Ghazi in Treatment: 31 Visit Information History Since Last Visit All ordered tests and consults were completed: Yes Patient Arrived: Wheel Chair Added or deleted any medications: No Arrival Time: 10:55 Any new allergies or adverse reactions: No Accompanied By: grandma Had a fall or experienced change in No Transfer Assistance: None activities of daily living that may affect Patient Identification Verified: Yes risk of falls: Secondary Verification Process Completed: Yes Signs or symptoms of abuse/neglect since last visito No Patient Requires Transmission-Based Precautions: No Hospitalized since last visit: No Patient Has Alerts: No Implantable device outside of the clinic excluding No cellular tissue based products placed in the center since last visit: Has Dressing in Place as Prescribed: Yes Pain Present Now: No Electronic Signature(s) Signed: 01/19/2023 5:38:06 PM By: Zenaida Deed RN, BSN Entered By: Zenaida Deed on 01/19/2023 07:59:24 -------------------------------------------------------------------------------- Encounter Discharge Information Details Patient Name: Date of Service: [name], Cory RIO N L. 01/19/2023 10:30 A M Medical Record Number: [id number] Patient Account Number: [id number] Date of Birth/Sex: Treating RN: [birth year] (16 y.o. [name] Primary Care Aquita Simmering: Edwina Barth Other Clinician: Referring Dearion Huot: Treating Shondrika Hoque/Extender: Eilene Ghazi in Treatment: 31 Encounter Discharge Information Items Post Procedure Vitals Discharge Condition: Stable Temperature (F): 97.7 Ambulatory Status: Wheelchair Pulse (bpm): 61 Discharge Destination: Home Respiratory Rate (breaths/min): 18 Transportation: Private Auto Blood Pressure (mmHg): 109/65 Accompanied By: grandmother Schedule Follow-up Appointment: Yes Clinical Summary of Care: Patient Declined Electronic Signature(s) Signed: 01/19/2023 5:38:06 PM By: Zenaida Deed RN, BSN Entered By: Zenaida Deed on 01/19/2023 08:45:13 [name] ([id number]) [id number] Page 2 of 8 -------------------------------------------------------------------------------- Lower Extremity Assessment Details Patient Name: Date of Service: [name] 01/19/2023 10:30 A M Medical Record Number: [id number] Patient Account Number: [id number] Date of Birth/Sex: Treating RN: 2006/02/17 (16 y.o. [name] Primary Care Egon Dittus: Edwina Barth Other Clinician: Referring Kolson Chovanec: Treating Kam Kushnir/Extender: [name] Weeks in Treatment: 31 Electronic Signature(s) Signed: 01/19/2023 5:38:06 PM By: Zenaida Deed RN, BSN Entered By: Zenaida Deed on 01/19/2023 07:59:43 -------------------------------------------------------------------------------- Multi Wound Chart Details Patient Name: Date of Service: [name], Cory RIO N L. 01/19/2023 10:30 A M Medical Record Number: [id number] Patient Account Number: [id number] Date of Birth/Sex: Treating RN: 2005-09-18 (16 y.o. M) Primary Care Aleayah Chico: Edwina Barth Other Clinician: Referring Okie Jansson: Treating Ysabella Babiarz/Extender: [name] Weeks in Treatment: 31 Vital Signs Height(in): 60 Pulse(bpm): 61 Weight(lbs): 110 Blood Pressure(mmHg): 109/65 Body Mass Index(BMI): 21.5 Temperature(F): 97.7 Respiratory Rate(breaths/min): 20 [1:Photos:]  [N/A:N/A] Right Ischium N/A N/A Wound Location: Pressure Injury N/A N/A Wounding Event: Pressure Ulcer N/A N/A Primary Etiology: Asthma, Paraplegia N/A N/A Comorbid History: 05/11/2022 N/A N/A Date Acquired: 10 N/A N/A Weeks of Treatment: Open N/A N/A Wound Status: No N/A N/A Wound Recurrence: 3.5x3.7x2 N/A N/A Measurements L x W x D (cm) 10.171 N/A N/A A (cm) : rea 20.342 N/A N/A Volume (cm) : -58.10% N/A N/A % Reduction in A rea: -532.50% N/A N/A % Reduction in Volume: 1 Starting Position 1 (o'clock): 4 Ending Position 1 (o'clock): 3.6 Maximum Distance 1 (cm): Yes N/A N/A Undermining: Category/Stage IV  N/A N/A Classification: Medium N/A N/A Exudate A mount: Serosanguineous N/A N/A Exudate Type: red, brown N/A N/A Exudate Color: Well defined, not attached N/A N/A Wound MarginTALEN, LEMAR ([id number]) [id number] Page 3 of 8 Large (67-100%) N/A N/A Granulation Amount: Red N/A N/A Granulation Quality: Small (1-33%) N/A N/A Necrotic Amount: Fat Layer (Subcutaneous Tissue): Yes N/A N/A Exposed Structures: Fascia: No Tendon: No Muscle: No Joint: No Bone: No None N/A N/A Epithelialization: Debridement - Selective/Open Wound N/A N/A Debridement: Pre-procedure Verification/Time Out 11:20 N/A N/A Taken: Slough N/A N/A Tissue Debrided: Non-Viable Tissue N/A N/A Level: 10.17 N/A N/A Debridement A (sq cm): rea Curette N/A N/A Instrument: Minimum N/A N/A Bleeding: Pressure N/A N/A Hemostasis A chieved: Insensate N/A N/A Procedural Pain: Insensate N/A N/A Post Procedural Pain: Procedure was tolerated well N/A N/A Debridement Treatment Response: 3.5x3.7x2 N/A N/A Post Debridement Measurements L x W x D (cm) 20.342 N/A N/A Post Debridement Volume: (cm) Category/Stage IV N/A N/A Post Debridement Stage: No Abnormalities Noted N/A N/A Periwound Skin Texture: No Abnormalities Noted N/A N/A Periwound Skin  Moisture: No Abnormalities Noted N/A N/A Periwound Skin Color: No Abnormality N/A N/A Temperature: Debridement N/A N/A Procedures Performed: Negative Pressure Wound Therapy Maintenance (NPWT) Treatment Notes Wound #1 (Ischium) Wound Laterality: Right Cleanser Soap and Water Discharge Instruction: May shower and wash wound with dial antibacterial soap and water prior to dressing change. Vashe 5.8 (oz) Discharge Instruction: Cleanse the wound with Vashe prior to applying a clean dressing using gauze sponges, not tissue or cotton balls. Peri-Wound Care Skin Prep Discharge Instruction: Use skin prep as directed Topical Gentamicin Discharge Instruction: thin layer to wound bed mixed with mupirocin Mupirocin Ointment Discharge Instruction: Apply Mupirocin (Bactroban) as instructed Primary Dressing Promogran Prisma Matrix, 4.34 (sq in) (silver collagen) Discharge Instruction: Moisten collagen with saline or hydrogel NPWT Secondary Dressing Secured With Compression Wrap Compression Stockings Add-Ons Electronic Signature(s) Signed: 01/19/2023 12:22:38 PM By: Duanne Guess MD FACS Entered By: Duanne Guess on 01/19/2023 09:22:38 [name] ([id number]) [id number] Page 4 of 8 -------------------------------------------------------------------------------- Multi-Disciplinary Care Plan Details Patient Name: Date of Service: [name] 01/19/2023 10:30 A M Medical Record Number: [id number] Patient Account Number: [id number] Date of Birth/Sex: Treating RN: Sep 02, 2005 (16 y.o. [name] Primary Care Donne Robillard: Edwina Barth Other Clinician: Referring Duran Ohern: Treating Renan Danese/Extender: Eilene Ghazi in Treatment: 31 Multidisciplinary Care Plan reviewed with physician Active Inactive Pressure Nursing Diagnoses: Knowledge deficit related to causes and risk factors for pressure ulcer  development Knowledge deficit related to management of pressures ulcers Potential for impaired tissue integrity related to pressure, friction, moisture, and shear Goals: Patient/caregiver will verbalize understanding of pressure ulcer management Date Initiated: 06/14/2022 Target Resolution Date: 02/01/2023 Goal Status: Active Interventions: Assess: immobility, friction, shearing, incontinence upon admission and as needed Assess offloading mechanisms upon admission and as needed Assess potential for pressure ulcer upon admission and as needed Treatment Activities: Patient referred for seating evaluation to ensure proper offloading : 06/14/2022 Pressure reduction/relief device ordered : 06/14/2022 Notes: Wound/Skin Impairment Nursing Diagnoses: Impaired tissue integrity Knowledge deficit related to ulceration/compromised skin integrity Goals: Patient/caregiver will verbalize understanding of skin care regimen Date Initiated: 06/14/2022 Target Resolution Date: 01/31/2023 Goal Status: Active Ulcer/skin breakdown will have a volume reduction of 30% by week 4 Date Initiated: 06/14/2022 Date Inactivated: 07/15/2022 Target Resolution Date: 09/14/2022 Unmet Reason: requires new w/c Goal Status: Unmet cushion Ulcer/skin breakdown will have a volume reduction of 50% by week 8 Date Initiated: 07/15/2022  Date Inactivated: 09/16/2022 Target Resolution Date: 08/12/2022 Goal Status: Unmet Unmet Reason: infection Interventions: Assess patient/caregiver ability to obtain necessary supplies Assess patient/caregiver ability to perform ulcer/skin care regimen upon admission and as needed Assess ulceration(s) every visit Provide education on ulcer and skin care Treatment Activities: Skin care regimen initiated : 06/14/2022 Topical wound management initiated : 06/14/2022 Notes: Electronic Signature(s) Signed: 01/19/2023 5:38:06 PM By: Zenaida Deed RN, BSN [name], [name] ([id number])  [phone].pdf Page 5 of 8 Entered By: Zenaida Deed on 01/19/2023 08:16:00 -------------------------------------------------------------------------------- Negative Pressure Wound Therapy Maintenance (NPWT) Details Patient Name: Date of Service: Cory Mount RIO N L. 01/19/2023 10:30 A M Medical Record Number: [id number] Patient Account Number: [id number] Date of Birth/Sex: Treating RN: April 20, 2006 (16 y.o. [name] Primary Care Jaleyah Longhi: Edwina Barth Other Clinician: Referring Salathiel Ferrara: Treating Lara Palinkas/Extender: [name] Weeks in Treatment: 31 NPWT Maintenance Performed for: Wound #1 Right Ischium Performed By: Zenaida Deed, RN Coverage Size (sq cm): 12.95 Pressure Type: Constant Pressure Setting: 125 mmHG Drain Type: None Primary Contact: Silver Sponge/Dressing Type: Foam- Black Date Initiated: 08/26/2022 Dressing Removed: Yes Quantity of Sponges/Gauze Removed: 1 Canister Changed: No Dressing Reapplied: Yes Quantity of Sponges/Gauze Inserted: 1 Respones T Treatment: o good Days On NPWT : 147 Post Procedure Diagnosis Same as Pre-procedure Electronic Signature(s) Signed: 01/19/2023 5:38:06 PM By: Zenaida Deed RN, BSN Entered By: Zenaida Deed on 01/19/2023 08:24:36 -------------------------------------------------------------------------------- Pain Assessment Details Patient Name: Date of Service: [name], Cory RIO N L. 01/19/2023 10:30 A M Medical Record Number: [id number] Patient Account Number: [id number] Date of Birth/Sex: Treating RN: [birth year] (16 y.o. M) Primary Care Marcheta Horsey: Edwina Barth Other Clinician: Referring Makenize Messman: Treating Givanni Staron/Extender: Eilene Ghazi in Treatment: 31 Active Problems Location of Pain Severity and Description of Pain Patient Has Paino No Site Locations Rate the pain. CORYE, JEANNOT ([id number])  [id number] Page 6 of 8 Rate the pain. Current Pain Level: 0 Pain Management and Medication Current Pain Management: Electronic Signature(s) Signed: 01/19/2023 5:38:06 PM By: Zenaida Deed RN, BSN Entered By: Zenaida Deed on 01/19/2023 07:59:36 -------------------------------------------------------------------------------- Patient/Caregiver Education Details Patient Name: Date of Service: [name] 9/4/2024andnbsp10:30 A M Medical Record Number: [id number] Patient Account Number: [id number] Date of Birth/Gender: Treating RN: 2005-05-23 (16 y.o. [name] Primary Care Physician: Edwina Barth Other Clinician: Referring Physician: Treating Physician/Extender: Eilene Ghazi in Treatment: 78 Education Assessment Education Provided To: Patient Education Topics Provided Pressure: Methods: Explain/Verbal Responses: Reinforcements needed, State content correctly Wound/Skin Impairment: Methods: Explain/Verbal Responses: Reinforcements needed, State content correctly Electronic Signature(s) Signed: 01/19/2023 5:38:06 PM By: Zenaida Deed RN, BSN Entered By: Zenaida Deed on 01/19/2023 08:16:25 -------------------------------------------------------------------------------- Wound Assessment Details Patient Name: Date of Service: [name], Cory RIO N L. 01/19/2023 10:30 A Trula Ore, [name] ([id number]) [id number] Page 7 of 8 Medical Record Number: [id number] Patient Account Number: [id number] Date of Birth/Sex: Treating RN: 02-12-2006 (16 y.o. [name] Primary Care Amaad Byers: Edwina Barth Other Clinician: Referring Merian Wroe: Treating Valerie Fredin/Extender: [name] Weeks in Treatment: 31 Wound Status Wound Number: 1 Primary Etiology: Pressure Ulcer Wound Location: Right Ischium Wound Status: Open Wounding Event: Pressure Injury Comorbid  History: Asthma, Paraplegia Date Acquired: 05/11/2022 Weeks Of Treatment: 31 Clustered Wound: No Photos Wound Measurements Length: (cm) 3.5 Width: (cm) 3.7 Depth: (cm) 2 Area: (cm) 10.171 Volume: (cm) 20.342 % Reduction in Area: -58.1% % Reduction in Volume: -532.5% Epithelialization: None Tunneling: No Undermining: Yes Starting Position (o'clock):  1 Ending Position (o'clock): 4 Maximum Distance: (cm) 3.6 Wound Description Classification: Category/Stage IV Wound Margin: Well defined, not attached Exudate Amount: Medium Exudate Type: Serosanguineous Exudate Color: red, brown Foul Odor After Cleansing: No Slough/Fibrino No Wound Bed Granulation Amount: Large (67-100%) Exposed Structure Granulation Quality: Red Fascia Exposed: No Necrotic Amount: Small (1-33%) Fat Layer (Subcutaneous Tissue) Exposed: Yes Necrotic Quality: Adherent Slough Tendon Exposed: No Muscle Exposed: No Joint Exposed: No Bone Exposed: No Periwound Skin Texture Texture Color No Abnormalities Noted: Yes No Abnormalities Noted: Yes Moisture Temperature / Pain No Abnormalities Noted: Yes Temperature: No Abnormality Treatment Notes Wound #1 (Ischium) Wound Laterality: Right Cleanser Soap and Water Discharge Instruction: May shower and wash wound with dial antibacterial soap and water prior to dressing change. Vashe 5.8 (oz) Discharge Instruction: Cleanse the wound with Vashe prior to applying a clean dressing using gauze sponges, not tissue or cotton balls. Peri-Wound Care Skin Prep FIELDS, KAMMERZELL ([id number]) [id number].pdf Page 8 of 8 Discharge Instruction: Use skin prep as directed Topical Gentamicin Discharge Instruction: thin layer to wound bed mixed with mupirocin Mupirocin Ointment Discharge Instruction: Apply Mupirocin (Bactroban) as instructed Primary Dressing Promogran Prisma Matrix, 4.34 (sq in) (silver collagen) Discharge Instruction: Moisten collagen  with saline or hydrogel NPWT Secondary Dressing Secured With Compression Wrap Compression Stockings Add-Ons Electronic Signature(s) Signed: 01/19/2023 5:38:06 PM By: Zenaida Deed RN, BSN Entered By: Zenaida Deed on 01/19/2023 08:12:35 -------------------------------------------------------------------------------- Vitals Details Patient Name: Date of Service: [name], Cory RIO N L. 01/19/2023 10:30 A M Medical Record Number: [id number] Patient Account Number: [id number] Date of Birth/Sex: Treating RN: 2006-01-13 (16 y.o. M) Primary Care Naveah Brave: Edwina Barth Other Clinician: Referring Keison Glendinning: Treating Lynell Greenhouse/Extender: [name] Weeks in Treatment: 31 Vital Signs Time Taken: 10:56 Temperature (F): 97.7 Height (in): 60 Pulse (bpm): 61 Weight (lbs): 110 Respiratory Rate (breaths/min): 20 Body Mass Index (BMI): 21.5 Blood Pressure (mmHg): 109/65 Reference Range: 80 - 120 mg / dl Electronic Signature(s) Signed: 01/19/2023 5:38:06 PM By: Zenaida Deed RN, BSN Entered By: Zenaida Deed on 01/19/2023 07:59:30

## 2023-01-20 NOTE — Progress Notes (Signed)
Cory, GIOVINO ([id number]) [id number] Page 1 of 11 Visit Report for 01/19/2023 Chief Complaint Document Details Patient Name: Date of Service: [name]. 01/19/2023 10:30 A M Medical Record Number: [id number] Patient Account Number: [id number] Date of Birth/Sex: Treating RN: 2006/04/10 (16 y.o. M) Primary Care Provider: Edwina Barth Other Clinician: Referring Provider: Treating Provider/Extender: Eilene Ghazi in Treatment: 31 Information Obtained from: Patient Chief Complaint Patient is at the clinic for treatment of an open pressure ulcer Electronic Signature(s) Signed: 01/19/2023 12:22:50 PM By: Duanne Guess MD FACS Entered By: Duanne Guess on 01/19/2023 09:22:49 -------------------------------------------------------------------------------- Debridement Details Patient Name: Date of Service: [name], [name]. 01/19/2023 10:30 A M Medical Record Number: [id number] Patient Account Number: [id number] Date of Birth/Sex: Treating RN: 06-14-2005 (16 y.o. [name] Primary Care Provider: Edwina Barth Other Clinician: Referring Provider: Treating Provider/Extender: [name] Weeks in Treatment: 31 Debridement Performed for Assessment: Wound #1 Right Ischium Performed By: Physician Duanne Guess, MD Debridement Type: Debridement Level of Consciousness (Pre-procedure): Awake and Alert Pre-procedure Verification/Time Out Yes - 11:20 Taken: Start Time: 11:21 Percent of Wound Bed Debrided: 100% T Area Debrided (cm): otal 10.17 Tissue and other material debrided: Non-Viable, Slough, Slough Level: Non-Viable Tissue Debridement Description: Selective/Open Wound Instrument: Curette Bleeding: Minimum Hemostasis Achieved: Pressure Procedural Pain: Insensate Post Procedural Pain: Insensate Response to Treatment: Procedure was tolerated well Level of Consciousness  (Post- Awake and Alert procedure): Post Debridement Measurements of Total Wound Length: (cm) 3.5 Stage: Category/Stage IV Width: (cm) 3.7 Depth: (cm) 2 Volume: (cm) 20.342 Character of Wound/Ulcer Post Debridement: Stable Post Procedure Diagnosis Same as [name], [name] ([id number]) [id number] Page 2 of 11 Notes Scribed for Dr Lady Gary by Zenaida Deed, RN Electronic Signature(s) Signed: 01/19/2023 1:37:38 PM By: Duanne Guess MD FACS Signed: 01/19/2023 5:38:06 PM By: Zenaida Deed RN, BSN Entered By: Zenaida Deed on 01/19/2023 08:25:40 -------------------------------------------------------------------------------- HPI Details Patient Name: Date of Service: [name], [name]. 01/19/2023 10:30 A M Medical Record Number: [id number] Patient Account Number: [id number] Date of Birth/Sex: Treating RN: 12-23-2005 (16 y.o. 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 Around Christmas time, he developed a pressure ulcer on his right ischium. This seems to be related to the cushioning in his wheelchair. They have an appointment coming up on Wednesday to have this checked and addressed. For some reason he has been prescribed doxycycline and Flagyl and has a couple days left of this. They have just been covering the site with gauze. On his right ischial area, he has an oval wound that extends into the fat layer. There is some slough and fibrinous exudate present on the surface. There is no erythema, induration, malodor, or purulent drainage to suggest infection. 06/21/2022: The wound measured larger and deeper today. There is evidence of pressure  induced tissue injury and the surface is a bit dry with fibrinous exudate accumulation. He does not yet have a new cushion for his wheelchair. 06/30/2022: His wound is deeper again. The surface is very clean. They did obtain the eggcrate cushion, but did not cut out an area to offload his ulcer. 07/07/2022: His wound measured a little bit smaller today. There is slough on the wound surface. For  some reason they did not get the Iodosorb gel and have been using Iodosorb pads; I do not think these are doing as good a job of chemical debridement as the gel would. They did cut out the space in his eggcrate foam cushion to accommodate his wound and I think this is helpful. 07/15/2022: His wound is deeper today. The tissue is pale, but the wound is fairly clean. For reasons that remain unclear, his wheelchair cushion has not been replaced and his caregivers have not contacted [employer] to do anything about it. 07/20/2022: His wound is a little bit smaller. There is still a lot of undermining. He has more slough accumulation today. We ordered him a Roho cushion last week, but he has not yet received it. 3/13; patient presents for follow-up. He has been using Hydrofera Blue to the right ischium wound bed. He has no issues or complaints today. 08/05/2022: The color of the tissue in the wound bed has improved markedly. It is much more beefy and red, whereas previously it has been quite pale. There is some slough on the wound surface. He finally got his custom molded wheelchair cushion for his school chair and a Roho cushion for home. There was an error on the part of the DME company for his low-air-loss mattress bed, but this is in the process of being resolved. 08/26/2022: The wound is measuring a little deeper; it looks like some fat simply separated in the center of his wound, rather than worsening of the pressure injury. The quality of the tissue continues to improve. His low-air-loss mattress was finally delivered.  He has his wound VAC with him for application today. 09/02/2022: The wound continues to worsen. I can palpate his trochanter in the deepest part of the wound, although the bone is not exposed. There is a strong odor coming from the wound and the tissue surface is gray. 09/09/2022: The progression of the wound seems to have been arrested. The color is markedly improved and is now beefy red. There are still some areas of the tissue that are more purpleish, suggesting ongoing pressure induced injury. The odor has abated. The culture that I took produced a polymicrobial population including Proteus, Pseudomonas, and multiple other species. He is currently taking levofloxacin and metronidazole. 09/16/2022: The wound continues to improve. The color and is red and the surface is appropriately moist. He has been in his Roho cushion and I do not see any areas of further pressure induced injury. He is completing his course of oral levofloxacin and Flagyl. 09/23/2022: No pressure induced tissue damage this week. There is no odor coming from the wound. No significant changes to the wound dimensions, however. 09/29/2022: The wound size remains about the same. The tissues are looking a bit healthier. There is slough on the wound surface. 10/07/2022: The wound is looking better. The undermining at 12:00 has closed down. The tunneling that extends over the trochanter is still present, but is tighter and narrower. The tissues look healthier. There is an odor, however, on the dressing. 10/14/2022: There has been very nice improvement in the wound over the past week. The tunneling of the trochanter is shorter by a good half centimeter and the tissue is much tighter. Near 12:00 the wound is nearly flush with the surrounding skin surface. The odor has resolved. 10/25/2022: He had his wound VAC off for graduation and there has been some breakdown over the trochanter. The bone is not yet exposed, but the tissue is [name], [name]  (  [id number]) [id number] Page 3 of 11 thinner. There is a layer of slough on the wound surface. 11/03/2022: There has been nice improvement since our last visit. The tissue over the trochanter has filled again and the space is contracting. The granulation tissue appears healthy and there is no slough. No evidence of ongoing pressure-induced tissue injury. 11/09/2022: The wound continues to improve. The undermined area is filling in more, but the tissue over the trochanter is still quite thin. There is minimal biofilm on the surface and no evidence of pressure induced tissue injury today. 11/16/2022: The undermined portion of the wound has filled in even more and the tissue over the trochanter is starting to get a bit thicker. Everything is extremely clean today without any odor or slough accumulation. 11/22/2022: The undermined area of the wound continues to contract. The wound is very clean. The tissue is robust and beefy red. 12/14/2022: The undermined area has come in by almost a cm. The tissue over the trochanter feels thicker. The wound is clean without any odor. 01/03/2023: The wound is stable, but there is some purpleish discoloration directly over the ischium consistent with pressure in this area. He has a little bit of slough accumulation around the edges of the wound. There is also an odor to the wound, despite use of topical gentamicin and mupirocin. 01/19/2023: The wound has filled in and the cavity is not as deep. The area overlying the trochanter is closing in. I do not appreciate any pressure-related tissue injury. There is some slough on the surface. The odor has abated. He is currently still taking the Augmentin I prescribed in response to his culture data. The x- ray that was done was concerning for possible osteomyelitis and he has had an MRI, but this has not yet been read. His albumin is normal. The referral to plastic surgery is on hold while we complete this  testing and I suspect he will require treatment for osteomyelitis before they will agree to see him. Electronic Signature(s) Signed: 01/19/2023 12:25:04 PM By: Duanne Guess MD FACS Entered By: Duanne Guess on 01/19/2023 09:25:04 -------------------------------------------------------------------------------- Physical Exam Details Patient Name: Date of Service: [name], [name]. 01/19/2023 10:30 A M Medical Record Number: [id number] Patient Account Number: [id number] Date of Birth/Sex: Treating RN: 07-09-2005 (16 y.o. M) Primary Care Provider: Edwina Barth Other Clinician: Referring Provider: Treating Provider/Extender: [name] Weeks in Treatment: 31 Constitutional . . . . no acute distress. Respiratory Normal work of breathing on room air. Notes 01/19/2023: The wound has filled in and the cavity is not as deep. The area overlying the trochanter is closing in. I do not appreciate any pressure-related tissue injury. There is some slough on the surface. The odor has abated. Electronic Signature(s) Signed: 01/19/2023 12:26:43 PM By: Duanne Guess MD FACS Entered By: Duanne Guess on 01/19/2023 09:26:43 -------------------------------------------------------------------------------- Physician Orders Details Patient Name: Date of Service: [name], [name]. 01/19/2023 10:30 A M Medical Record Number: [id number] Patient Account Number: [id number] Date of Birth/Sex: Treating RN: [birth year] (16 y.o. 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 - Dr. Lady Gary RM 1 Return A Thurs 9/19 @ 11:15 am Anesthetic Wound #1 Right Ischium (In clinic) Topical Lidocaine 4% applied to wound bed Bathing/ Shower/ Hygiene May shower and wash wound with soap and water. Negative Presssure Wound Therapy Medela Wound Vac continuously at 124mm/hg Black Foam Off-Loading Low air-loss mattress (Group 2) - ordered through [employer] Roho cushion for wheelchair - sent referral to [employer] Turn and reposition every 2 hours - use arms to lift up off the wheelchair at least hourly while up in wheelchair Additional Orders / Instructions Follow Nutritious Diet - 70- 100 gms of protein per day. add protein drink such as Premeir Protein 2 times per day. Use the Juven for added protein intake. Wound Treatment Wound #1 - Ischium Wound Laterality: Right Cleanser: Soap and Water 3 x Per Week/15 Days Discharge Instructions: May shower and wash wound with dial antibacterial soap and water prior to dressing change. Cleanser: Vashe 5.8 (oz) 3 x Per Week/15 Days Discharge Instructions: Cleanse the wound with Vashe prior to applying a clean dressing using gauze sponges, not tissue or cotton balls. 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 Duanne Guess MD Patient Name: Provider: 24-Sep-2005 [id number] Date of Birth: NPI#: Judie Petit [id number] Sex: DEA #: [phone] 2012 Phone #: License #: UPN: Patient Address: [address] Eligha Bridegroom [facility] Wound [place], [place] [address] [address] [place], [place] [address] [phone] Allergies latex Provider's Orders Roho cushion for wheelchair - sent referral to [employer] Hand Signature: Date(s): Prescription 01/19/2023 [place] Mink Stark. Duanne Guess MD Patient Name: Provider: [birth year] [id number] Date of Birth: NPI#: Judie Petit [id number] Sex: DEA #: 2012 Phone #: License #: UPN: Patient Address: [address] Eligha Bridegroom [facility] Wound [place], [place] [address] [address] [place], [place] [address] [phone] Allergies latex Provider's Orders Infectious Disease - ICD10: L89.314 - osteomyelitis of right ischium with stage 4 pressure ulcer Hand Signature: Date(s): Electronic Signature(s) Signed: 01/19/2023 12:29:56 PM By: Duanne Guess MD FACS Entered By: Duanne Guess on 01/19/2023 09:29:56 -------------------------------------------------------------------------------- Problem List Details Patient Name: Date of Service: [name], [name]. 01/19/2023 10:30 A M Medical Record Number: [id number] Patient Account Number: [id number] Date of Birth/Sex: Treating RN: 2005/07/07 (16 y.o. 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 01/19/2023 10:30 A M Medical Record Number: [id number] Patient Account Number: [id number] Date of Birth/Sex: Treating RN: 2005/09/16 (16 y.o. 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 Around Christmas time, he developed a pressure ulcer on his right ischium. This seems to be related to the cushioning in his wheelchair. They have an appointment coming up on Wednesday to have this checked and addressed. For some reason he has been prescribed doxycycline and Flagyl and has a couple days left of this. They have just been covering the site with  gauze. On his right ischial area, he has an oval wound that extends into the fat layer. There is some slough and fibrinous exudate present on the surface. There is no erythema, induration, malodor, or purulent drainage to suggest infection. 06/21/2022: The wound measured larger and deeper today. There is evidence of pressure induced tissue injury and the surface is a bit dry with fibrinous exudate accumulation. He does not yet have a new cushion for his wheelchair. 06/30/2022: His wound is deeper again. The surface is very clean. They did obtain the eggcrate cushion, but did not cut out an area to offload his ulcer. 07/07/2022: His wound measured a little bit smaller today. There is slough on the wound surface. For some reason they did not get the Iodosorb gel and have been using Iodosorb pads; I do not think these are doing as good a job of chemical debridement as the gel would. They did cut out the space in his eggcrate foam cushion to accommodate his wound and I think this is helpful. 07/15/2022: His wound is deeper today. The tissue is pale, but the wound is fairly clean. For reasons that remain unclear, his wheelchair cushion has not been replaced and his caregivers have not contacted [employer] to do anything about it. 07/20/2022: His wound is a little bit smaller. There is still a lot of undermining. He has more slough accumulation today. We ordered him a Roho cushion last week, but he has not yet received it. 3/13; patient presents for follow-up. He has been using Hydrofera Blue to the right ischium wound bed. He has no issues or complaints today. 08/05/2022: The color of the tissue in the wound bed has improved markedly. It is much more beefy and red, whereas previously it has been quite pale. There is some slough on the wound surface. He finally got his custom molded wheelchair cushion for his school chair and a Roho cushion for home. There was an error on the part of the DME company for his  low-air-loss mattress bed, but this is in the process of being resolved. [name], [name] ([id number]) [id number] Page 7 of 11 08/26/2022: The wound is measuring a little deeper; it looks like some fat simply separated in the center of his wound, rather than worsening of  the pressure injury. The quality of the tissue continues to improve. His low-air-loss mattress was finally delivered. He has his wound VAC with him for application today. 09/02/2022: The wound continues to worsen. I can palpate his trochanter in the deepest part of the wound, although the bone is not exposed. There is a strong odor coming from the wound and the tissue surface is gray. 09/09/2022: The progression of the wound seems to have been arrested. The color is markedly improved and is now beefy red. There are still some areas of the tissue that are more purpleish, suggesting ongoing pressure induced injury. The odor has abated. The culture that I took produced a polymicrobial population including Proteus, Pseudomonas, and multiple other species. He is currently taking levofloxacin and metronidazole. 09/16/2022: The wound continues to improve. The color and is red and the surface is appropriately moist. He has been in his Roho cushion and I do not see any areas of further pressure induced injury. He is completing his course of oral levofloxacin and Flagyl. 09/23/2022: No pressure induced tissue damage this week. There is no odor coming from the wound. No significant changes to the wound dimensions, however. 09/29/2022: The wound size remains about the same. The tissues are looking a bit healthier. There is slough on the wound surface. 10/07/2022: The wound is looking better. The undermining at 12:00 has closed down. The tunneling that extends over the trochanter is still present, but is tighter and narrower. The tissues look healthier. There is an odor, however, on the dressing. 10/14/2022: There has been very  nice improvement in the wound over the past week. The tunneling of the trochanter is shorter by a good half centimeter and the tissue is much tighter. Near 12:00 the wound is nearly flush with the surrounding skin surface. The odor has resolved. 10/25/2022: He had his wound VAC off for graduation and there has been some breakdown over the trochanter. The bone is not yet exposed, but the tissue is thinner. There is a layer of slough on the wound surface. 11/03/2022: There has been nice improvement since our last visit. The tissue over the trochanter has filled again and the space is contracting. The granulation tissue appears healthy and there is no slough. No evidence of ongoing pressure-induced tissue injury. 11/09/2022: The wound continues to improve. The undermined area is filling in more, but the tissue over the trochanter is still quite thin. There is minimal biofilm on the surface and no evidence of pressure induced tissue injury today. 11/16/2022: The undermined portion of the wound has filled in even more and the tissue over the trochanter is starting to get a bit thicker. Everything is extremely clean today without any odor or slough accumulation. 11/22/2022: The undermined area of the wound continues to contract. The wound is very clean. The tissue is robust and beefy red. 12/14/2022: The undermined area has come in by almost a cm. The tissue over the trochanter feels thicker. The wound is clean without any odor. 01/03/2023: The wound is stable, but there is some purpleish discoloration directly over the ischium consistent with pressure in this area. He has a little bit of slough accumulation around the edges of the wound. There is also an odor to the wound, despite use of topical gentamicin and mupirocin. 01/19/2023: The wound has filled in and the cavity is not as deep. The area overlying the trochanter is closing in. I do not appreciate any pressure-related tissue injury. There is some slough on the  surface.  The odor has abated. He is currently still taking the Augmentin I prescribed in response to his culture data. The x- ray that was done was concerning for possible osteomyelitis and he has had an MRI, but this has not yet been read. His albumin is normal. The referral to plastic surgery is on hold while we complete this testing and I suspect he will require treatment for osteomyelitis before they will agree to see him. Patient History Information obtained from Caregiver, Chart. Family History Kidney Disease - Father, No family history of Cancer, Diabetes, Heart Disease, Hereditary Spherocytosis, Hypertension, Lung Disease, Seizures, Stroke, Thyroid Problems, Tuberculosis. Social History Never smoker, Marital Status - Single, Alcohol Use - Never, Drug Use - No History, Caffeine Use - Moderate. Medical History Eyes Denies history of Cataracts, Glaucoma, Optic Neuritis Ear/Nose/Mouth/Throat Denies history of Chronic sinus problems/congestion, Middle ear problems Respiratory Patient has history of Asthma - mild Endocrine Denies history of Type I Diabetes, Type II Diabetes Genitourinary Denies history of End Stage Renal Disease Integumentary (Skin) Denies history of History of Burn Neurologic Patient has history of Paraplegia Oncologic Denies history of Received Chemotherapy, Received Radiation Psychiatric Denies history of Anorexia/bulimia, Confinement Anxiety Hospitalization/Surgery History - myringotomy. - hip surgery. - ventriculoperitoneal shunt. Medical A Surgical History Notes nd Ear/Nose/Mouth/Throat allergic rhinitis Gastrointestinal ostomy, bowel program Genitourinary neurogenic bladder Integumentary (Skin) eczema Musculoskeletal [name], [name] ([id number]) [phone].pdf Page 8 of 11 contracture of left hip, spinabifida Neurologic developmental delay, loloprosencephaly, hydrocephalus Objective Constitutional no acute  distress. Vitals Time Taken: 10:56 AM, Height: 60 in, Weight: 110 lbs, BMI: 21.5, Temperature: 97.7 F, Pulse: 61 bpm, Respiratory Rate: 20 breaths/min, Blood Pressure: 109/65 mmHg. Respiratory Normal work of breathing on room air. General Notes: 01/19/2023: The wound has filled in and the cavity is not as deep. The area overlying the trochanter is closing in. I do not appreciate any pressure-related tissue injury. There is some slough on the surface. The odor has abated. Integumentary (Hair, Skin) Wound #1 status is Open. Original cause of wound was Pressure Injury. The date acquired was: 05/11/2022. The wound has been in treatment 31 weeks. The wound is located on the Right Ischium. The wound measures 3.5cm length x 3.7cm width x 2cm depth; 10.171cm^2 area and 20.342cm^3 volume. There is Fat Layer (Subcutaneous Tissue) exposed. There is no tunneling noted, however, there is undermining starting at 1:00 and ending at 4:00 with a maximum distance of 3.6cm. There is a medium amount of serosanguineous drainage noted. The wound margin is well defined and not attached to the wound base. There is large (67-100%) red granulation within the wound bed. There is a small (1-33%) amount of necrotic tissue within the wound bed including Adherent Slough. The periwound skin appearance had no abnormalities noted for texture. The periwound skin appearance had no abnormalities noted for moisture. The periwound skin appearance had no abnormalities noted for color. Periwound temperature was noted as No Abnormality. 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 There was a Selective/Open Wound Non-Viable Tissue  Debridement with a total area of 10.17 sq cm performed by Duanne Guess, MD. With the following instrument(s): Curette to remove Non-Viable tissue/material. Material removed includes [facility]. No specimens were taken. A time out was conducted at 11:20, prior to the start of  the procedure. A Minimum amount of bleeding was controlled with Pressure. The procedure was tolerated well with a pain level of Insensate throughout and a pain level of Insensate following the procedure. Post Debridement Measurements: 3.5cm length x 3.7cm width x 2cm depth; 20.342cm^3 volume. Post debridement Stage noted as Category/Stage IV. Character of Wound/Ulcer Post Debridement is stable. Post procedure Diagnosis Wound #1: Same as Pre-Procedure General Notes: Scribed for Dr Lady Gary by Zenaida Deed, RN. Plan Follow-up Appointments: Return Appointment in 2 weeks. - Dr. Lady Gary RM 1 Thurs 9/19 @ 11:15 am Anesthetic: Wound #1 Right Ischium: (In clinic) Topical Lidocaine 4% applied to wound bed Bathing/ Shower/ Hygiene: May shower and wash wound with soap and water. Negative Presssure Wound Therapy: Medela Wound Vac continuously at 141mm/hg Black Foam Off-Loading: CASMER, LANE ([id number]) [phone].pdf Page 9 of 11 Low air-loss mattress (Group 2) - ordered through [employer] Roho cushion for wheelchair - sent referral to [employer] Turn and reposition every 2 hours - use arms to lift up off the wheelchair at least hourly while up in wheelchair Additional Orders / Instructions: Follow Nutritious Diet - 70- 100 gms of protein per day. add protein drink such as Premeir Protein 2 times per day. Use the Juven for added protein intake. 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 Cleanser: Vashe 5.8 (oz) 3 x Per Week/15 Days Discharge Instructions: Cleanse the wound with Vashe prior to applying a clean dressing using gauze sponges, not tissue or cotton balls. Peri-Wound Care: Skin Prep 3 x Per Week/15 Days Discharge Instructions: Use skin prep as directed Topical: Gentamicin 3 x Per Week/15 Days Discharge Instructions: thin layer to wound bed mixed with mupirocin Topical: Mupirocin Ointment 3 x Per Week/15 Days Discharge Instructions: Apply Mupirocin (Bactroban) as instructed Prim Dressing: Promogran Prisma Matrix, 4.34 (sq in) (silver collagen) 3 x Per Week/15 Days ary Discharge Instructions: Moisten collagen with saline or hydrogel Prim Dressing: NPWT 3 x Per Week/15 Days ary 01/19/2023: The wound has filled in and the cavity is not as deep. The area overlying the trochanter is closing in. I do not appreciate any pressure-related tissue injury. There is some slough on the surface. The odor has abated. He is currently still taking the Augmentin I prescribed in response to his culture data. The x-ray that was done was concerning for possible osteomyelitis and he has had an MRI, but this has not yet been read. His albumin is normal. The referral to plastic surgery is on hold while we complete this testing and I suspect he will require treatment for osteomyelitis before they will agree to see him. I used a curette to debride slough from the wound surface. We will continue to use topical gentamicin and mupirocin with Prisma silver collagen on the surface with negative pressure wound therapy. He will complete the oral Augmentin. I have placed a referral to infectious disease for further evaluation and management of presumed osteomyelitis. We are also going to contact the radiology department to get a formal read of his MRI. He will follow-up in 2 weeks. Electronic Signature(s) Signed: 01/19/2023 12:28:59 PM By: Duanne Guess MD FACS Entered By: Duanne Guess on 01/19/2023 09:28:59 -------------------------------------------------------------------------------- HxROS Details Patient Name: Date of Service: [name], [name]. 01/19/2023  10:30 A M Medical Record Number: [id number] Patient Account Number: [id number] Date of Birth/Sex: Treating RN: 2005/10/24 (17 y.o. M) Primary Care Provider: Edwina Barth Other Clinician: Referring Provider: Treating Provider/Extender: Eilene Ghazi in Treatment: 31 Information Obtained From Caregiver Chart Eyes Medical History: Negative for: Cataracts; Glaucoma; Optic Neuritis Ear/Nose/Mouth/Throat Medical History: Negative for: Chronic sinus problems/congestion; Middle ear problems Past Medical History Notes: allergic rhinitis Respiratory Medical History: Positive for: Asthma - mild Gastrointestinal Medical History: Past Medical History Notes: ostomy, bowel program [name], [name] ([id number]) [id number] Page 10 of 11 Endocrine Medical History: Negative for: Type I Diabetes; Type II Diabetes Genitourinary Medical History: Negative for: End Stage Renal Disease Past Medical History Notes: neurogenic bladder Integumentary (Skin) Medical History: Negative for: History of Burn Past Medical History Notes: eczema Musculoskeletal Medical History: Past Medical History Notes: contracture of left hip, spinabifida Neurologic Medical History: 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, loloprosencephaly, hydrocephalus Oncologic Medical History: Negative for: Received Chemotherapy; Received Radiation Psychiatric Medical History: Negative for: Anorexia/bulimia; Confinement Anxiety Immunizations Pneumococcal Vaccine: Received Pneumococcal Vaccination: No Implantable Devices No devices added Hospitalization / Surgery History Type of  Hospitalization/Surgery myringotomy hip surgery ventriculoperitoneal shunt Family and Social History Cancer: No; Diabetes: No; Heart Disease: No; Hereditary Spherocytosis: No; Hypertension: No; Kidney Disease: Yes - Father; Lung Disease: No; Seizures: No; Stroke: No; Thyroid Problems: No; Tuberculosis: No; Never smoker; Marital Status - Single; Alcohol Use: Never; Drug Use: No History; Caffeine Use: Moderate; Financial Concerns: No; Food, Clothing or Shelter Needs: No; Support System Lacking: No; Transportation Concerns: No Electronic Signature(s) Signed: 01/19/2023 1:37:38 PM By: Duanne Guess MD FACS Entered By: Duanne Guess on 01/19/2023 09:26:10 -------------------------------------------------------------------------------- SuperBill Details Patient Name: Date of Service: [name], Cory RIO N Elbert Ewings 01/19/2023 [name] ([id number]) [id number] Page 11 of 11 Medical Record Number: [id number] Patient Account Number: [id number] Date of Birth/Sex: Treating RN: Dec 20, 2005 (16 y.o. M) Primary Care Provider: Edwina Barth Other Clinician: Referring Provider: Treating Provider/Extender: [name] Weeks in Treatment: 31 Diagnosis Coding ICD-10 Codes Code Description L89.314 Pressure ulcer of right buttock, stage 4 Q05.2 Lumbar spina bifida with hydrocephalus K59.2 Neurogenic bowel, not elsewhere classified N31.9 Neuromuscular dysfunction of bladder, unspecified J45.20 Mild intermittent asthma, uncomplicated Facility Procedures : CPT4 Code: 16010932 9 Description: 7597 - DEBRIDE WOUND 1ST 20 SQ CM OR < ICD-10 Diagnosis Description L89.314 Pressure ulcer of right buttock, stage 4 Modifier: Quantity: 1 : CPT4 Code: 35573220 9 Description: 7605 - WOUND VAC-50 SQ CM OR LESS Modifier: Quantity: 1 Physician Procedures : CPT4 Code Description Modifier [id number] - WC PHYS LEVEL 4 - EST PT 25 ICD-10 Diagnosis Description  L89.314 Pressure ulcer of right buttock, stage 4 Q05.2 Lumbar spina bifida with hydrocephalus K59.2 Neurogenic bowel, not elsewhere classified N31.9  Neuromuscular dysfunction of bladder, unspecified Quantity: 1 : [id number] - WC PHYS DEBR WO ANESTH 20 SQ CM ICD-10 Diagnosis Description L89.314 Pressure ulcer of right buttock, stage 4 Quantity: 1 Electronic Signature(s) Signed: 01/19/2023 12:29:24 PM By: Duanne Guess MD FACS Entered By: Duanne Guess on 01/19/2023 15:17:61

## 2023-01-24 ENCOUNTER — Other Ambulatory Visit: Payer: Self-pay | Admitting: Emergency Medicine

## 2023-01-25 MED ORDER — VITAMIN D 25 MCG (1000 UNIT) PO TABS: 1000.0000 [IU] | ORAL_TABLET | Freq: Every day | ORAL | 3 refills | Status: AC

## 2023-01-25 MED ORDER — POLYETHYLENE GLYCOL 3350 17 G PO PACK: 17.0000 g | PACK | ORAL | 0 refills | Status: DC

## 2023-02-02 ENCOUNTER — Other Ambulatory Visit: Payer: Self-pay

## 2023-02-02 ENCOUNTER — Telehealth: Payer: Self-pay | Admitting: Pharmacy Technician

## 2023-02-02 ENCOUNTER — Encounter: Payer: Self-pay | Admitting: Internal Medicine

## 2023-02-02 ENCOUNTER — Ambulatory Visit (INDEPENDENT_AMBULATORY_CARE_PROVIDER_SITE_OTHER): Payer: Medicaid Other | Admitting: Internal Medicine

## 2023-02-02 VITALS — BP 110/69 | HR 68 | Temp 98.0°F

## 2023-02-02 DIAGNOSIS — L89890 Pressure ulcer of other site, unstageable: Secondary | ICD-10-CM | POA: Diagnosis present

## 2023-02-02 DIAGNOSIS — M86251 Subacute osteomyelitis, right femur: Secondary | ICD-10-CM | POA: Diagnosis not present

## 2023-02-02 DIAGNOSIS — M869 Osteomyelitis, unspecified: Secondary | ICD-10-CM | POA: Insufficient documentation

## 2023-02-02 NOTE — Progress Notes (Signed)
[facility]      Reason for Consult: osteomyelitis of femur    Referring Physician: Dr. Lady Gary    Patient ID: [name], male    DOB: 06-05-2005, 17 y.o.   MRN: [id number]  HPI:   [name] is here for evaluation of osteomyelitis. He has a history of spina bifida and wheelchair bound and about 6 months ago developed a pressure ulcer with tunneling down to the trochanter.  He has made some progress and wound filling in some but developed some odor and started on empiric Augmentin.  There was concern for a deep infection and underwent an MRI on 9/4 and c/w acute osteomyelitis of the proximal right femur and some myositis of the right gluteus.  He has had no fever or chills.  Has a wound vac.   Past Medical History:  Diagnosis Date   Spina bifida    Spina bifida, unspecified hydrocephalus presence, unspecified spinal region (HCC)    Stomatitis    Urinary tract infection     Prior to Admission medications   Medication Sig Start Date End Date Taking? Authorizing Provider  albuterol (PROVENTIL HFA;VENTOLIN HFA) 108 (90 Base) MCG/ACT inhaler Inhale 2 puffs into the lungs every 6 (six) hours as needed for shortness of breath. 07/27/07  Yes [provider]  albuterol (PROVENTIL) (2.5 MG/3ML) 0.083% nebulizer solution Take 2.5 mg by nebulization every 6 (six) hours as needed for wheezing or shortness of breath.   Yes [provider]  budesonide (PULMICORT) 0.5 MG/2ML nebulizer solution Take 0.5 mg by nebulization 3 (three) times daily as needed (shortness of breath).   Yes [provider]  budesonide-formoterol (SYMBICORT) 80-4.5 MCG/ACT inhaler Inhale 1 puff into the lungs daily as needed (shortness of breath/wheezing).   Yes [provider]  cetirizine (ZYRTEC) 10 MG tablet Take 10 mg by mouth daily as needed for allergies.  03/03/20  Yes [provider]  cholecalciferol (VITAMIN D3) 25 MCG (1000 UNIT) tablet Take 1  tablet (1,000 Units total) by mouth daily. 01/25/23 01/20/24 Yes Sagardia, Eilleen Kempf, MD  clindamycin-benzoyl peroxide [facility]) gel Apply 1 application  topically daily. For acne 09/06/17  Yes [provider]  polyethylene glycol (MIRALAX / GLYCOLAX) 17 g packet Take 17 g by mouth every other day. 01/25/23  Yes Sagardia, Eilleen Kempf, MD  triamcinolone ointment (KENALOG) 0.5 % Apply 1 application topically every other day. For eczema 03/25/20  Yes [provider]  metroNIDAZOLE (FLAGYL) 500 MG tablet Take 1 tablet (500 mg total) by mouth 3 (three) times daily. DO NOT CONSUME ALCOHOL WHILE TAKING THIS MEDICATION. Patient not taking: Reported on 09/02/2022 06/05/22   Wallis Bamberg, PA-C    Allergies  Allergen Reactions   Latex Other (See Comments)    Contraindicated due to health conditons    Social History   Tobacco Use   Smoking status: Never   Smokeless tobacco: Never  Vaping Use   Vaping status: Never Used  Substance Use Topics   Alcohol use: No   Drug use: Never    Family History  Adopted: Yes  Problem Relation Age of Onset   Multiple sclerosis Maternal Grandmother     Review of Systems  Constitutional: negative for fevers and chills All other systems reviewed and are negative    Constitutional: in no apparent distress  Vitals:   02/02/23 0848  BP: 110/69  Pulse: 68  Temp: 98 F (36.7 C)  SpO2: 99%   EYES: anicteric Respiratory:  normal respiratory effort  Labs: Lab Results  Component Value Date   WBC 8.0 04/23/2020   HGB 14.0 04/23/2020   HCT 43.7 04/23/2020   MCV 78.6 04/23/2020   PLT 278 04/23/2020    Lab Results  Component Value Date   CREATININE 0.56 04/05/2020   BUN 12 04/05/2020   NA 139 04/05/2020   K 4.0 04/05/2020   CL 102 04/05/2020   CO2 27 04/05/2020    Lab Results  Component Value Date   ALT 22 04/05/2020   AST 21 04/05/2020   ALKPHOS 95 04/05/2020   BILITOT 0.4 04/05/2020   INR 1.0 04/05/2020     Assessment: 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  No culture for guidance.  Good albumin level, good care of wound with offloading reported.    Plan: 1)  dalbavancin 1500 mg IV x 1 then a second dose on day 8 at 1500 mg IV 2) ESR, CRP, CMP, CBC today Follow up in about 3 weeks to discuss progress of wound, can be video visit

## 2023-02-02 NOTE — Telephone Encounter (Signed)
Auth Submission: NO AUTH NEEDED Site of care: Site of care: [facility] Payer: [place] MEDICAID Medication & CPT/J Code(s) submitted:  [place] [id number] Route of submission (phone, fax, portal):  Phone # Fax # Auth type: Buy/Bill HB Units/visits requested: 1500MG  X 2 DOSES Reference number:  Approval from: 02/02/23 to 04/16/23

## 2023-02-03 ENCOUNTER — Encounter (HOSPITAL_BASED_OUTPATIENT_CLINIC_OR_DEPARTMENT_OTHER): Payer: Medicaid Other | Admitting: General Surgery

## 2023-02-03 ENCOUNTER — Ambulatory Visit (INDEPENDENT_AMBULATORY_CARE_PROVIDER_SITE_OTHER): Payer: Medicaid Other

## 2023-02-03 VITALS — BP 117/77 | HR 87 | Temp 98.2°F | Resp 18 | Ht 60.0 in

## 2023-02-03 DIAGNOSIS — L03119 Cellulitis of unspecified part of limb: Secondary | ICD-10-CM | POA: Diagnosis not present

## 2023-02-03 DIAGNOSIS — L89314 Pressure ulcer of right buttock, stage 4: Secondary | ICD-10-CM | POA: Diagnosis not present

## 2023-02-03 DIAGNOSIS — M86251 Subacute osteomyelitis, right femur: Secondary | ICD-10-CM | POA: Diagnosis not present

## 2023-02-03 LAB — CBC
HCT: 45.1 % (ref 36.0–49.0)
Hemoglobin: 13.6 g/dL (ref 12.0–16.9)
MCH: 22.9 pg — ABNORMAL LOW (ref 25.0–35.0)
MCHC: 30.2 g/dL — ABNORMAL LOW (ref 31.0–36.0)
MCV: 76.1 fL — ABNORMAL LOW (ref 78.0–98.0)
MPV: 10.1 fL (ref 7.5–12.5)
Platelets: 323 10*3/uL (ref 140–400)
RBC: 5.93 10*6/uL — ABNORMAL HIGH (ref 4.10–5.70)
RDW: 15.3 % — ABNORMAL HIGH (ref 11.0–15.0)
WBC: 8 10*3/uL (ref 4.5–13.0)

## 2023-02-03 LAB — COMPREHENSIVE METABOLIC PANEL
AG Ratio: 1.2 (calc) (ref 1.0–2.5)
ALT: 27 U/L (ref 8–46)
AST: 18 U/L (ref 12–32)
Albumin: 4.2 g/dL (ref 3.6–5.1)
Alkaline phosphatase (APISO): 97 U/L (ref 46–169)
BUN/Creatinine Ratio: 33 (calc) — ABNORMAL HIGH (ref 6–22)
BUN: 15 mg/dL (ref 7–20)
CO2: 27 mmol/L (ref 20–32)
Calcium: 10.2 mg/dL (ref 8.9–10.4)
Chloride: 103 mmol/L (ref 98–110)
Creat: 0.46 mg/dL — ABNORMAL LOW (ref 0.60–1.24)
Globulin: 3.5 g/dL (calc) (ref 2.1–3.5)
Glucose, Bld: 92 mg/dL (ref 65–99)
Potassium: 4.2 mmol/L (ref 3.8–5.1)
Sodium: 140 mmol/L (ref 135–146)
Total Bilirubin: 0.3 mg/dL (ref 0.2–1.1)
Total Protein: 7.7 g/dL (ref 6.3–8.2)

## 2023-02-03 LAB — SEDIMENTATION RATE: Sed Rate: 29 mm/h — ABNORMAL HIGH (ref 0–15)

## 2023-02-03 LAB — C-REACTIVE PROTEIN: CRP: 15.2 mg/L — ABNORMAL HIGH (ref <8.0)

## 2023-02-03 MED ORDER — DEXTROSE 5 % IV SOLN
1500.0000 mg | Freq: Once | INTRAVENOUS | Status: AC
  Administered 2023-02-03: 1500 mg via INTRAVENOUS
  Filled 2023-02-03: qty 75

## 2023-02-03 NOTE — Progress Notes (Signed)
[name] ([id number]) [id number] Page 1 of 7 Visit Report for 02/03/2023 Arrival Information Details Patient Name: Date of Service: [name] Cory N L. 02/03/2023 11:15 A M Medical Record Number: [id number] Patient Account Number: [id number] Date of Birth/Sex: Treating RN: Feb 03, 2006 (16 y.o. [name] Primary Care [name]: [name] Other Clinician: Referring [name]: Treating [name] in Treatment: 1 Visit Information History Since Last Visit Added or deleted any medications: Yes Patient Arrived: Wheel Chair Any new allergies or adverse reactions: No Arrival Time: 11:23 Had a fall or experienced change in No Accompanied By: grandparents activities of daily living that may affect Transfer Assistance: None risk of falls: Patient Identification Verified: Yes Signs or symptoms of abuse/neglect since last visito No Secondary Verification Process Completed: Yes Hospitalized since last visit: No Patient Requires Transmission-Based Precautions: No Implantable device outside of the clinic excluding No Patient Has Alerts: No cellular tissue based products placed in the center since last visit: Has Dressing in Place as Prescribed: Yes Pain Present Now: No Electronic Signature(s) Signed: 02/03/2023 3:36:31 PM By: Zenaida Deed RN, BSN Entered By: Zenaida Deed on 02/03/2023 11:31:09 -------------------------------------------------------------------------------- Encounter Discharge Information Details Patient Name: Date of Service: [name], Cory Cory N L. 02/03/2023 11:15 A M Medical Record Number: [id number] Patient Account Number: [id number] Date of Birth/Sex: Treating RN: 06-14-2005 (16 y.o. [name] Primary Care Natoya Viscomi: [name] Other Clinician: Referring [name]: Treating [name] in Treatment:  26 Encounter Discharge Information Items Discharge Condition: Stable Ambulatory Status: Wheelchair Discharge Destination: Home Transportation: Private Auto Accompanied By: grandparents Schedule Follow-up Appointment: Yes Clinical Summary of Care: Patient Declined Electronic Signature(s) Signed: 02/03/2023 3:36:31 PM By: Zenaida Deed RN, BSN Entered By: Zenaida Deed on 02/03/2023 12:06:18 [name] ([id number]) [id number] Page 2 of 7 -------------------------------------------------------------------------------- Lower Extremity Assessment Details Patient Name: Date of Service: [name]. 02/03/2023 11:15 A M Medical Record Number: [id number] Patient Account Number: [id number] Date of Birth/Sex: Treating RN: 03-31-2006 (16 y.o. [name] Primary Care [name]: [name] Other Clinician: Referring [name]: Treating [name] Weeks in Treatment: 44 Electronic Signature(s) Signed: 02/03/2023 3:36:31 PM By: Zenaida Deed RN, BSN Entered By: Zenaida Deed on 02/03/2023 11:32:33 -------------------------------------------------------------------------------- Multi Wound Chart Details Patient Name: Date of Service: [name], Cory Cory N L. 02/03/2023 11:15 A M Medical Record Number: [id number] Patient Account Number: [id number] Date of Birth/Sex: Treating RN: 07-11-2005 (16 y.o. M) Primary Care [name]: [name] Other Clinician: Referring [name]: Treating [name] Weeks in Treatment: 37 Vital Signs Height(in): 60 Pulse(bpm): 95 Weight(lbs): 110 Blood Pressure(mmHg): 115/68 Body Mass Index(BMI): 21.5 Temperature(F): 97.6 Respiratory Rate(breaths/min): 18 [1:Photos:] [N/A:N/A] Right Ischium N/A N/A Wound Location: Pressure Injury N/A N/A Wounding Event: Pressure Ulcer N/A N/A Primary Etiology: Asthma, Paraplegia  N/A N/A Comorbid History: 05/11/2022 N/A N/A Date Acquired: 51 N/A N/A Weeks of Treatment: Open N/A N/A Wound Status: No N/A N/A Wound Recurrence: 3.4x3.4x1.6 N/A N/A Measurements L x W x D (cm) 9.079 N/A N/A A (cm) : rea 14.527 N/A N/A Volume (cm) : -41.20% N/A N/A % Reduction in A rea: -351.70% N/A N/A % Reduction in Volume: 2 Starting Position 1 (o'clock): 6 Ending Position 1 (o'clock): 2.4 Maximum Distance 1 (cm): Yes N/A N/A Undermining: Category/Stage IV N/A N/A Classification: Medium N/A N/A Exudate A Stark: Serosanguineous N/A N/A Exudate Type: red, brown N/A N/A Exudate Color: Well defined, not  attached N/A N/A Wound MarginDANNA, Stark ([id number]) [id number] Page 3 of 7 Large (67-100%) N/A N/A Granulation Amount: Red N/A N/A Granulation Quality: Small (1-33%) N/A N/A Necrotic Amount: Fat Layer (Subcutaneous Tissue): Yes N/A N/A Exposed Structures: Fascia: No Tendon: No Muscle: No Joint: No Bone: No None N/A N/A Epithelialization: No Abnormalities Noted N/A N/A Periwound Skin Texture: No Abnormalities Noted N/A N/A Periwound Skin Moisture: No Abnormalities Noted N/A N/A Periwound Skin Color: No Abnormality N/A N/A Temperature: Negative Pressure Wound Therapy N/A N/A Procedures Performed: Maintenance (NPWT) Treatment Notes Electronic Signature(s) Signed: 02/03/2023 11:54:51 AM By: Duanne Guess MD FACS Entered By: Duanne Guess on 02/03/2023 11:54:51 -------------------------------------------------------------------------------- Multi-Disciplinary Care Plan Details Patient Name: Date of Service: [name], Cory Cory N L. 02/03/2023 11:15 A M Medical Record Number: [id number] Patient Account Number: [id number] Date of Birth/Sex: Treating RN: 2006/03/19 (16 y.o. [name] Primary Care Ranetta Armacost: [name] Other Clinician: Referring Araminta [name]: Treating Makahla [name]/Extender: [name] in Treatment: 36 Multidisciplinary Care Plan reviewed with physician Active Inactive Pressure Nursing Diagnoses: Knowledge deficit related to causes and risk factors for pressure ulcer development Knowledge deficit related to management of pressures ulcers Potential for impaired tissue integrity related to pressure, friction, moisture, and shear Goals: Patient/caregiver will verbalize understanding of pressure ulcer management Date Initiated: 06/14/2022 Target Resolution Date: 03/01/2023 Goal Status: Active Interventions: Assess: immobility, friction, shearing, incontinence upon admission and as needed Assess offloading mechanisms upon admission and as needed Assess potential for pressure ulcer upon admission and as needed Treatment Activities: Patient referred for seating evaluation to ensure proper offloading : 06/14/2022 Pressure reduction/relief device ordered : 06/14/2022 Notes: Wound/Skin Impairment Nursing Diagnoses: Impaired tissue integrity Knowledge deficit related to ulceration/compromised skin integrity Goals: Patient/caregiver will verbalize understanding of skin care regimen [name], [name] ([id number]) [id number] Page 4 of 7 Date Initiated: 06/14/2022 Target Resolution Date: 03/02/2023 Goal Status: Active Ulcer/skin breakdown will have a volume reduction of 30% by week 4 Date Initiated: 06/14/2022 Date Inactivated: 07/15/2022 Target Resolution Date: 09/14/2022 Unmet Reason: requires new w/c Goal Status: Unmet cushion Ulcer/skin breakdown will have a volume reduction of 50% by week 8 Date Initiated: 07/15/2022 Date Inactivated: 09/16/2022 Target Resolution Date: 08/12/2022 Goal Status: Unmet Unmet Reason: infection Interventions: Assess patient/caregiver ability to obtain necessary supplies Assess patient/caregiver ability to perform ulcer/skin care regimen upon admission and as needed Assess ulceration(s)  every visit Provide education on ulcer and skin care Treatment Activities: Skin care regimen initiated : 06/14/2022 Topical wound management initiated : 06/14/2022 Notes: Electronic Signature(s) Signed: 02/03/2023 3:36:31 PM By: Zenaida Deed RN, BSN Entered By: Zenaida Deed on 02/03/2023 11:44:47 -------------------------------------------------------------------------------- Negative Pressure Wound Therapy Maintenance (NPWT) Details Patient Name: Date of Service: [name] Cory N L. 02/03/2023 11:15 A M Medical Record Number: [id number] Patient Account Number: [id number] Date of Birth/Sex: Treating RN: 11/18/2005 (16 y.o. [name] Primary Care Frederick Marro: [name] Other Clinician: Referring Wateen Varon: Treating Lucious Zou/Extender: [name] Weeks in Treatment: 33 NPWT Maintenance Performed for: Wound #1 Right Ischium Performed By: Zenaida Deed, RN Type: Medela [employer] Coverage Size (sq cm): 11.56 Pressure Type: Constant Pressure Setting: 125 mmHG Drain Type: None Primary Contact: Other : silver collagen Sponge/Dressing Type: Foam- Black Date Initiated: 08/26/2022 Dressing Removed: Yes Quantity of Sponges/Gauze Removed: 1 Canister Changed: No Dressing Reapplied: Yes Quantity of Sponges/Gauze Inserted: 1 Respones T Treatment: o good Days On NPWT : 162 Post Procedure Diagnosis Same as Pre-procedure Electronic Signature(s) Signed: 02/03/2023 3:36:31 PM By: Daneil Dan,  Bonita Quin RN, BSN Entered By: Zenaida Deed on 02/03/2023 11:50:46 [name] ([id number]) [id number] Page 5 of 7 -------------------------------------------------------------------------------- Pain Assessment Details Patient Name: Date of Service: [name] Cory N L. 02/03/2023 11:15 A M Medical Record Number: [id number] Patient Account Number: [id number] Date of Birth/Sex: Treating RN: 2006/02/22 (16 y.o. [name] Primary Care Bayley Yarborough: [name] Other Clinician: Referring Taejah Ohalloran: Treating Fabrizio Filip/Extender: [name] Weeks in Treatment: 45 Active Problems Location of Pain Severity and Description of Pain Patient Has Paino No Site Locations Rate the pain. Current Pain Level: 0 Pain Management and Medication Current Pain Management: Electronic Signature(s) Signed: 02/03/2023 3:36:31 PM By: Zenaida Deed RN, BSN Entered By: Zenaida Deed on 02/03/2023 11:32:19 -------------------------------------------------------------------------------- Patient/Caregiver Education Details Patient Name: Date of Service: [name] Cory [name] 9/19/2024andnbsp11:15 A M Medical Record Number: [id number] Patient Account Number: [id number] Date of Birth/Gender: Treating RN: 12-07-2005 (16 y.o. [name] Primary Care Physician: [name] Other Clinician: Referring Physician: Treating Physician/Extender: [name] in Treatment: 46 Education Assessment Education Provided To: Patient Education Topics Provided Pressure: Methods: Explain/Verbal Responses: Reinforcements needed, State content correctly Wound/Skin Impairment: Methods: Explain/Verbal Responses: Reinforcements needed, State content correctly ADISON, LAROWE ([id number]) [id number] Page 6 of 7 Electronic Signature(s) Signed: 02/03/2023 3:36:31 PM By: Zenaida Deed RN, BSN Entered By: Zenaida Deed on 02/03/2023 11:45:17 -------------------------------------------------------------------------------- Wound Assessment Details Patient Name: Date of Service: [name], Ray Church Cory N L. 02/03/2023 11:15 A M Medical Record Number: [id number] Patient Account Number: [id number] Date of Birth/Sex: Treating RN: [birth year] (16 y.o. 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 Wound Status: Open Wounding Event: Pressure Injury Comorbid History: Asthma, Paraplegia Date Acquired: 05/11/2022 Weeks Of Treatment: 33 Clustered Wound: No Photos Wound Measurements Length: (cm) 3.4 Width: (cm) 3.4 Depth: (cm) 1.6 Area: (cm) 9.079 Volume: (cm) 14.527 % Reduction in Area: -41.2% % Reduction in Volume: -351.7% Epithelialization: None Undermining: Yes Starting Position (o'clock): 2 Ending Position (o'clock): 6 Maximum Distance: (cm) 2.4 Wound Description Classification: Category/Stage IV Wound Margin: Well defined, not attached Exudate Amount: Medium Exudate Type: Serosanguineous Exudate Color: red, brown Foul Odor After Cleansing: No Slough/Fibrino No Wound Bed Granulation Amount: Large (67-100%) Exposed Structure Granulation Quality: Red Fascia Exposed: No Necrotic Amount: Small (1-33%) Fat Layer (Subcutaneous Tissue) Exposed: Yes Necrotic Quality: Adherent Slough Tendon Exposed: No Muscle Exposed: No Joint Exposed: No Bone Exposed: No Periwound Skin Texture Texture Color No Abnormalities Noted: Yes No Abnormalities Noted: Yes Moisture Temperature / Pain [name], [name] ([id number]) [id number] Page 7 of 7 No Abnormalities Noted: Yes Temperature: No Abnormality Treatment Notes Wound #1 (Ischium) Wound Laterality: Right Cleanser Soap and Water Discharge Instruction: May shower and wash wound with dial antibacterial soap and water prior to dressing change. Vashe 5.8 (oz) Discharge Instruction: Cleanse the wound with Vashe prior to applying a clean dressing using gauze sponges, not tissue or cotton balls. Peri-Wound Care Skin Prep Discharge Instruction: Use skin prep as directed Topical Gentamicin Discharge Instruction: thin layer to wound bed  mixed with mupirocin Mupirocin Ointment Discharge Instruction: Apply Mupirocin (Bactroban) as instructed Primary Dressing Promogran Prisma Matrix, 4.34 (sq in) (silver collagen) Discharge Instruction: Moisten collagen with saline or hydrogel NPWT Secondary Dressing Secured With Compression Wrap Compression Stockings Add-Ons Electronic Signature(s) Signed: 02/03/2023 3:36:31 PM By: Zenaida Deed RN,  BSN Entered By: Zenaida Deed on 02/03/2023 11:41:53 -------------------------------------------------------------------------------- Vitals Details Patient Name: Date of Service: [name] Cory N L. 02/03/2023 11:15 A M Medical Record Number: [id number] Patient Account Number: [id number] Date of Birth/Sex: Treating RN: [birth year] (16 y.o. [name] Primary Care Jaime Grizzell: [name] Other Clinician: Referring Izabellah Dadisman: Treating Decorey Wahlert/Extender: [name] Weeks in Treatment: 57 Vital Signs Time Taken: 11:31 Temperature (F): 97.6 Height (in): 60 Pulse (bpm): 95 Weight (lbs): 110 Respiratory Rate (breaths/min): 18 Body Mass Index (BMI): 21.5 Blood Pressure (mmHg): 115/68 Reference Range: 80 - 120 mg / dl Electronic Signature(s) Signed: 02/03/2023 3:36:31 PM By: Zenaida Deed RN, BSN Entered By: Zenaida Deed on 02/03/2023 11:31:31

## 2023-02-03 NOTE — Progress Notes (Signed)
Diagnosis: Subacute Osteomyelitis of right femur  Provider:  Chilton Greathouse MD  Procedure: IV Infusion  IV Type: Peripheral, IV Location: R Antecubital  Dalvance (Dalbavancin), Dose: 1500mg   Infusion Start Time: 1314  Infusion Stop Time: 1350  Post Infusion IV Care: Observation period completed and Peripheral IV Discontinued  Discharge: Condition: Good, Destination: Home . AVS Provided  Performed by:  Nat Math, RN

## 2023-02-03 NOTE — Progress Notes (Signed)
topical gentamicin and mupirocin. 01/19/2023: The wound has filled in and the cavity is not as deep. The area overlying the trochanter is closing in. I do not appreciate any pressure-related tissue injury. There is some slough on the surface. The odor has abated. He is currently still taking the Augmentin I prescribed in response to his culture data. The x- ray that was done was concerning for possible  osteomyelitis and he has had an MRI, but this has not yet been read. His albumin is normal. The referral to plastic surgery is on hold while we complete this testing and I suspect he will require treatment for osteomyelitis before they will agree to see him. 02/03/2023: The wound continues to contract. No pressure-related tissue injury. No odor. He saw infectious disease yesterday for osteomyelitis and will receive Dalvance infusions on a weekly basis. He does have an appointment to see plastics at [facility] coming up on the 26th of this month. Electronic Signature(s) Signed: 02/03/2023 11:56:11 AM By: Cory Guess MD FACS Entered By: [name] on 02/03/2023 11:56:11 -------------------------------------------------------------------------------- Physical Exam Details Patient Name: Date of Service: [name], Cory RIO N L. 02/03/2023 11:15 A M Medical Record Number: [id number] Patient Account Number: [id number] Date of Birth/Sex: Treating RN: 09/02/2005 (16 y.o. M) Primary Care Provider: Edwina Stark Other Clinician: Referring Provider: Treating Provider/Extender: [name] in Treatment: 56 Constitutional . . . . no acute distress. Respiratory Normal work of breathing on room air. Notes 02/03/2023: The wound continues to contract. No pressure-related tissue injury. No odor. Electronic Signature(s) Signed: 02/03/2023 11:57:03 AM By: Cory Guess MD Clide Deutscher, [name] ([id number]) [id number] Page 3 of 10 Entered By: [name] on 02/03/2023 11:57:03 -------------------------------------------------------------------------------- Physician Orders Details Patient Name: Date of Service: [name] 02/03/2023 11:15 A M Medical Record Number: [id number] Patient Account Number: [id number] Date of Birth/Sex: Treating RN: 11-22-2005 (16 y.o. 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 - Dr. Lady Gary RM 1 Return A Thurs 10/3 @ 11:15 am Anesthetic Wound #1 Right Ischium (In clinic) Topical Lidocaine 4% applied to wound bed Bathing/ Shower/ Hygiene May shower and wash wound with soap and water. Negative Presssure Wound Therapy Medela Wound Vac continuously at 152mm/hg Black Foam Off-Loading Low air-loss mattress (Group 2) - ordered through [employer] Roho cushion for wheelchair - sent referral to [employer] Turn and reposition every 2 hours - use arms to lift up off the wheelchair at least hourly while up in wheelchair Additional Orders / Instructions Follow Nutritious Diet - 70- 100 gms of protein per day. add protein drink such as Premeir Protein 2 times per day. Use the Juven for added protein intake. Wound Treatment Wound #1 - Ischium Wound Laterality: Right Cleanser: Soap and Water 3 x Per Week/15 Days Discharge Instructions: May shower and wash wound with dial antibacterial soap and water prior to dressing change. Cleanser: Vashe 5.8 (oz) 3 x Per Week/15 Days Discharge Instructions: Cleanse the wound with Vashe prior to applying a clean dressing using gauze sponges, not tissue or cotton balls. Peri-Wound Care: Skin Prep 3 x Per Week/15 Days Discharge  Instructions: Use skin prep as directed Topical: Gentamicin 3 x Per Week/15 Days Discharge Instructions: thin layer to wound bed mixed with mupirocin Topical: Mupirocin Ointment 3 x Per Week/15 Days Discharge Instructions: Apply  Mupirocin (Bactroban) as instructed Prim Dressing: Promogran Prisma Matrix, 4.34 (sq in) (silver collagen) 3 x Per Week/15 Days ary Discharge Instructions: Moisten collagen with saline or hydrogel Prim Dressing: NPWT ary 3 x Per Week/15 Days [name] ([id number]) [id number] Page 4 of 10 Electronic Signature(s) Signed: 02/03/2023 12:33:23 PM By: Cory Guess MD FACS Entered By: [name] on 02/03/2023 11:59:15 Prescription 02/03/2023 -------------------------------------------------------------------------------- [name] Cory Guess MD Patient Name: Provider: 2005-10-03 [id number] Date of Birth: NPI#: Judie Petit [id number] Sex: DEA #: [phone] Phone #: License #: UPN: Patient Address: [address] Eligha Bridegroom [facility] Wound [place], [place] [address] [address] [place], [place] [address] [phone] Allergies latex Provider's Orders Roho cushion for wheelchair - sent referral to [employer] Hand Signature: Date(s): Electronic Signature(s) Signed: 02/03/2023 12:33:23 PM By: Cory Guess MD FACS Entered By: [name] on 02/03/2023 11:59:15 -------------------------------------------------------------------------------- Problem List Details Patient Name: Date of Service: [name], Cory RIO N L. 02/03/2023 11:15 A M Medical Record Number: [id number] Patient Account Number: [id number] Date of Birth/Sex: Treating RN: 2005/05/21 (16 y.o. 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 02/03/2023 11:15 A M Medical Record Number: [id number] Patient Account Number: [id number] Date of Birth/Sex: Treating RN: February 02, 2006 (16 y.o. 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 Around Christmas time, he developed a pressure ulcer on his right ischium. This seems to be related to the cushioning in his wheelchair. They have an appointment coming up on Wednesday to have this checked and addressed. For some reason he has been prescribed doxycycline and Flagyl and has a couple days left of this. They have just been covering the site with gauze. On his right ischial area, he has an oval wound that extends into the fat layer. There is some slough and fibrinous exudate present on the surface. There is no erythema, induration, malodor, or purulent drainage to suggest infection. 06/21/2022: The wound measured larger and deeper today. There is evidence of  pressure induced tissue injury and the surface is a bit dry with fibrinous exudate accumulation. He does not yet have a new cushion for his wheelchair. 06/30/2022: His wound is deeper again. The surface is very clean. They  [name], [name] ([id number]) [id number] Page 1 of 10 Visit Report for 02/03/2023 Chief Complaint Document Details Patient Name: Date of Service: Cory Mount RIO N L. 02/03/2023 11:15 A M Medical Record Number: [id number] Patient Account Number: [id number] Date of Birth/Sex: Treating RN: Apr 21, 2006 (16 y.o. M) Primary Care Provider: Edwina Stark Other Clinician: Referring Provider: Treating Provider/Extender: [name] in Treatment: 41 Information Obtained from: Patient Chief Complaint Patient is at the clinic for treatment of an open pressure ulcer Electronic Signature(s) Signed: 02/03/2023 11:54:57 AM By: Cory Guess MD FACS Entered By: [name] on 02/03/2023 11:54:57 -------------------------------------------------------------------------------- HPI Details Patient Name: Date of Service: [name], Cory RIO N L. 02/03/2023 11:15 A M Medical Record Number: [id number] Patient Account Number: [id number] Date of Birth/Sex: Treating RN: Jan 13, 2006 (16 y.o. M) Primary Care Provider: Edwina Stark Other Clinician: Referring Provider: Treating Provider/Extender: [name] in Treatment: 22 History of Present Illness HPI Description: ADMISSION 06/14/2022 This is an 17 year old young man with lumbar spina bifida, followed primarily at [facility] in their spina bifida clinic. Around Christmas time, he developed a pressure ulcer on his right ischium. This seems to be related to the cushioning in his wheelchair. They have an appointment coming up on Wednesday to have this checked and addressed. For some reason he has been prescribed doxycycline and Flagyl and has a couple days left of this. They have just been covering the site with gauze. On his right ischial area, he has an oval wound that extends into the fat layer. There is some slough and fibrinous exudate present on the surface. There is  no erythema, induration, malodor, or purulent drainage to suggest infection. 06/21/2022: The wound measured larger and deeper today. There is evidence of pressure induced tissue injury and the surface is a bit dry with fibrinous exudate accumulation. He does not yet have a new cushion for his wheelchair. 06/30/2022: His wound is deeper again. The surface is very clean. They did obtain the eggcrate cushion, but did not cut out an area to offload his ulcer. 07/07/2022: His wound measured a little bit smaller today. There is slough on the wound surface. For some reason they did not get the Iodosorb gel and have been using Iodosorb pads; I do not think these are doing as good a job of chemical debridement as the gel would. They did cut out the space in his eggcrate foam cushion to accommodate his wound and I think this is helpful. 07/15/2022: His wound is deeper today. The tissue is pale, but the wound is fairly clean. For reasons that remain unclear, his wheelchair cushion has not been replaced and his caregivers have not contacted [employer] to do anything about it. 07/20/2022: His wound is a little bit smaller. There is still a lot of undermining. He has more slough accumulation today. We ordered him a Roho cushion last week, but he has not yet received it. 3/13; patient presents for follow-up. He has been using Hydrofera Blue to the right ischium wound bed. He has no issues or complaints today. 08/05/2022: The color of the tissue in the wound bed has improved markedly. It is much more beefy and red, whereas previously it has been quite pale. There is some slough on the wound surface. He finally got his custom molded wheelchair cushion for his school chair and a Roho cushion for home. There was an error on the part of the DME company for his low-air-loss mattress bed, but this is  [name], [name] ([id number]) [id number] Page 1 of 10 Visit Report for 02/03/2023 Chief Complaint Document Details Patient Name: Date of Service: Cory Mount RIO N L. 02/03/2023 11:15 A M Medical Record Number: [id number] Patient Account Number: [id number] Date of Birth/Sex: Treating RN: Apr 21, 2006 (16 y.o. M) Primary Care Provider: Edwina Stark Other Clinician: Referring Provider: Treating Provider/Extender: [name] in Treatment: 41 Information Obtained from: Patient Chief Complaint Patient is at the clinic for treatment of an open pressure ulcer Electronic Signature(s) Signed: 02/03/2023 11:54:57 AM By: Cory Guess MD FACS Entered By: [name] on 02/03/2023 11:54:57 -------------------------------------------------------------------------------- HPI Details Patient Name: Date of Service: [name], Cory RIO N L. 02/03/2023 11:15 A M Medical Record Number: [id number] Patient Account Number: [id number] Date of Birth/Sex: Treating RN: Jan 13, 2006 (16 y.o. M) Primary Care Provider: Edwina Stark Other Clinician: Referring Provider: Treating Provider/Extender: [name] in Treatment: 22 History of Present Illness HPI Description: ADMISSION 06/14/2022 This is an 17 year old young man with lumbar spina bifida, followed primarily at [facility] in their spina bifida clinic. Around Christmas time, he developed a pressure ulcer on his right ischium. This seems to be related to the cushioning in his wheelchair. They have an appointment coming up on Wednesday to have this checked and addressed. For some reason he has been prescribed doxycycline and Flagyl and has a couple days left of this. They have just been covering the site with gauze. On his right ischial area, he has an oval wound that extends into the fat layer. There is some slough and fibrinous exudate present on the surface. There is  no erythema, induration, malodor, or purulent drainage to suggest infection. 06/21/2022: The wound measured larger and deeper today. There is evidence of pressure induced tissue injury and the surface is a bit dry with fibrinous exudate accumulation. He does not yet have a new cushion for his wheelchair. 06/30/2022: His wound is deeper again. The surface is very clean. They did obtain the eggcrate cushion, but did not cut out an area to offload his ulcer. 07/07/2022: His wound measured a little bit smaller today. There is slough on the wound surface. For some reason they did not get the Iodosorb gel and have been using Iodosorb pads; I do not think these are doing as good a job of chemical debridement as the gel would. They did cut out the space in his eggcrate foam cushion to accommodate his wound and I think this is helpful. 07/15/2022: His wound is deeper today. The tissue is pale, but the wound is fairly clean. For reasons that remain unclear, his wheelchair cushion has not been replaced and his caregivers have not contacted [employer] to do anything about it. 07/20/2022: His wound is a little bit smaller. There is still a lot of undermining. He has more slough accumulation today. We ordered him a Roho cushion last week, but he has not yet received it. 3/13; patient presents for follow-up. He has been using Hydrofera Blue to the right ischium wound bed. He has no issues or complaints today. 08/05/2022: The color of the tissue in the wound bed has improved markedly. It is much more beefy and red, whereas previously it has been quite pale. There is some slough on the wound surface. He finally got his custom molded wheelchair cushion for his school chair and a Roho cushion for home. There was an error on the part of the DME company for his low-air-loss mattress bed, but this is  [name], [name] ([id number]) [id number] Page 1 of 10 Visit Report for 02/03/2023 Chief Complaint Document Details Patient Name: Date of Service: Cory Mount RIO N L. 02/03/2023 11:15 A M Medical Record Number: [id number] Patient Account Number: [id number] Date of Birth/Sex: Treating RN: Apr 21, 2006 (16 y.o. M) Primary Care Provider: Edwina Stark Other Clinician: Referring Provider: Treating Provider/Extender: [name] in Treatment: 41 Information Obtained from: Patient Chief Complaint Patient is at the clinic for treatment of an open pressure ulcer Electronic Signature(s) Signed: 02/03/2023 11:54:57 AM By: Cory Guess MD FACS Entered By: [name] on 02/03/2023 11:54:57 -------------------------------------------------------------------------------- HPI Details Patient Name: Date of Service: [name], Cory RIO N L. 02/03/2023 11:15 A M Medical Record Number: [id number] Patient Account Number: [id number] Date of Birth/Sex: Treating RN: Jan 13, 2006 (16 y.o. M) Primary Care Provider: Edwina Stark Other Clinician: Referring Provider: Treating Provider/Extender: [name] in Treatment: 22 History of Present Illness HPI Description: ADMISSION 06/14/2022 This is an 17 year old young man with lumbar spina bifida, followed primarily at [facility] in their spina bifida clinic. Around Christmas time, he developed a pressure ulcer on his right ischium. This seems to be related to the cushioning in his wheelchair. They have an appointment coming up on Wednesday to have this checked and addressed. For some reason he has been prescribed doxycycline and Flagyl and has a couple days left of this. They have just been covering the site with gauze. On his right ischial area, he has an oval wound that extends into the fat layer. There is some slough and fibrinous exudate present on the surface. There is  no erythema, induration, malodor, or purulent drainage to suggest infection. 06/21/2022: The wound measured larger and deeper today. There is evidence of pressure induced tissue injury and the surface is a bit dry with fibrinous exudate accumulation. He does not yet have a new cushion for his wheelchair. 06/30/2022: His wound is deeper again. The surface is very clean. They did obtain the eggcrate cushion, but did not cut out an area to offload his ulcer. 07/07/2022: His wound measured a little bit smaller today. There is slough on the wound surface. For some reason they did not get the Iodosorb gel and have been using Iodosorb pads; I do not think these are doing as good a job of chemical debridement as the gel would. They did cut out the space in his eggcrate foam cushion to accommodate his wound and I think this is helpful. 07/15/2022: His wound is deeper today. The tissue is pale, but the wound is fairly clean. For reasons that remain unclear, his wheelchair cushion has not been replaced and his caregivers have not contacted [employer] to do anything about it. 07/20/2022: His wound is a little bit smaller. There is still a lot of undermining. He has more slough accumulation today. We ordered him a Roho cushion last week, but he has not yet received it. 3/13; patient presents for follow-up. He has been using Hydrofera Blue to the right ischium wound bed. He has no issues or complaints today. 08/05/2022: The color of the tissue in the wound bed has improved markedly. It is much more beefy and red, whereas previously it has been quite pale. There is some slough on the wound surface. He finally got his custom molded wheelchair cushion for his school chair and a Roho cushion for home. There was an error on the part of the DME company for his low-air-loss mattress bed, but this is  [name], [name] ([id number]) [id number] Page 1 of 10 Visit Report for 02/03/2023 Chief Complaint Document Details Patient Name: Date of Service: Cory Mount RIO N L. 02/03/2023 11:15 A M Medical Record Number: [id number] Patient Account Number: [id number] Date of Birth/Sex: Treating RN: Apr 21, 2006 (16 y.o. M) Primary Care Provider: Edwina Stark Other Clinician: Referring Provider: Treating Provider/Extender: [name] in Treatment: 41 Information Obtained from: Patient Chief Complaint Patient is at the clinic for treatment of an open pressure ulcer Electronic Signature(s) Signed: 02/03/2023 11:54:57 AM By: Cory Guess MD FACS Entered By: [name] on 02/03/2023 11:54:57 -------------------------------------------------------------------------------- HPI Details Patient Name: Date of Service: [name], Cory RIO N L. 02/03/2023 11:15 A M Medical Record Number: [id number] Patient Account Number: [id number] Date of Birth/Sex: Treating RN: Jan 13, 2006 (16 y.o. M) Primary Care Provider: Edwina Stark Other Clinician: Referring Provider: Treating Provider/Extender: [name] in Treatment: 22 History of Present Illness HPI Description: ADMISSION 06/14/2022 This is an 17 year old young man with lumbar spina bifida, followed primarily at [facility] in their spina bifida clinic. Around Christmas time, he developed a pressure ulcer on his right ischium. This seems to be related to the cushioning in his wheelchair. They have an appointment coming up on Wednesday to have this checked and addressed. For some reason he has been prescribed doxycycline and Flagyl and has a couple days left of this. They have just been covering the site with gauze. On his right ischial area, he has an oval wound that extends into the fat layer. There is some slough and fibrinous exudate present on the surface. There is  no erythema, induration, malodor, or purulent drainage to suggest infection. 06/21/2022: The wound measured larger and deeper today. There is evidence of pressure induced tissue injury and the surface is a bit dry with fibrinous exudate accumulation. He does not yet have a new cushion for his wheelchair. 06/30/2022: His wound is deeper again. The surface is very clean. They did obtain the eggcrate cushion, but did not cut out an area to offload his ulcer. 07/07/2022: His wound measured a little bit smaller today. There is slough on the wound surface. For some reason they did not get the Iodosorb gel and have been using Iodosorb pads; I do not think these are doing as good a job of chemical debridement as the gel would. They did cut out the space in his eggcrate foam cushion to accommodate his wound and I think this is helpful. 07/15/2022: His wound is deeper today. The tissue is pale, but the wound is fairly clean. For reasons that remain unclear, his wheelchair cushion has not been replaced and his caregivers have not contacted [employer] to do anything about it. 07/20/2022: His wound is a little bit smaller. There is still a lot of undermining. He has more slough accumulation today. We ordered him a Roho cushion last week, but he has not yet received it. 3/13; patient presents for follow-up. He has been using Hydrofera Blue to the right ischium wound bed. He has no issues or complaints today. 08/05/2022: The color of the tissue in the wound bed has improved markedly. It is much more beefy and red, whereas previously it has been quite pale. There is some slough on the wound surface. He finally got his custom molded wheelchair cushion for his school chair and a Roho cushion for home. There was an error on the part of the DME company for his low-air-loss mattress bed, but this is  Instructions: Use skin prep as directed Topical: Gentamicin 3 x Per Week/15 Days Discharge Instructions: thin layer to wound bed mixed with mupirocin Topical: Mupirocin Ointment 3 x Per Week/15 Days Discharge Instructions: Apply  Mupirocin (Bactroban) as instructed Prim Dressing: Promogran Prisma Matrix, 4.34 (sq in) (silver collagen) 3 x Per Week/15 Days ary Discharge Instructions: Moisten collagen with saline or hydrogel Prim Dressing: NPWT ary 3 x Per Week/15 Days [name] ([id number]) [id number] Page 4 of 10 Electronic Signature(s) Signed: 02/03/2023 12:33:23 PM By: Cory Guess MD FACS Entered By: [name] on 02/03/2023 11:59:15 Prescription 02/03/2023 -------------------------------------------------------------------------------- [name] Cory Guess MD Patient Name: Provider: 2005-10-03 [id number] Date of Birth: NPI#: Judie Petit [id number] Sex: DEA #: [phone] Phone #: License #: UPN: Patient Address: [address] [place] Bridegroom [facility] Wound [place], [place] [address] [address] [place], [place] [address] [phone] Allergies latex Provider's Orders Roho cushion for wheelchair - sent referral to [facility] Signature: Date(s): Electronic Signature(s) Signed: 02/03/2023 12:33:23 PM By: Cory Guess MD FACS Entered By: [name] on 02/03/2023 11:59:15 -------------------------------------------------------------------------------- Problem List Details Patient Name: Date of Service: [name], Cory RIO N L. 02/03/2023 11:15 A M Medical Record Number: [id number] Patient Account Number: [id number] Date of Birth/Sex: Treating RN: 2005/05/21 (16 y.o. 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 02/03/2023 11:15 A M Medical Record Number: [id number] Patient Account Number: [id number] Date of Birth/Sex: Treating RN: February 02, 2006 (16 y.o. 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 Around Christmas time, he developed a pressure ulcer on his right ischium. This seems to be related to the cushioning in his wheelchair. They have an appointment coming up on Wednesday to have this checked and addressed. For some reason he has been prescribed doxycycline and Flagyl and has a couple days left of this. They have just been covering the site with gauze. On his right ischial area, he has an oval wound that extends into the fat layer. There is some slough and fibrinous exudate present on the surface. There is no erythema, induration, malodor, or purulent drainage to suggest infection. 06/21/2022: The wound measured larger and deeper today. There is evidence of  pressure induced tissue injury and the surface is a bit dry with fibrinous exudate accumulation. He does not yet have a new cushion for his wheelchair. 06/30/2022: His wound is deeper again. The surface is very clean. They  Instructions: Use skin prep as directed Topical: Gentamicin 3 x Per Week/15 Days Discharge Instructions: thin layer to wound bed mixed with mupirocin Topical: Mupirocin Ointment 3 x Per Week/15 Days Discharge Instructions: Apply  Mupirocin (Bactroban) as instructed Prim Dressing: Promogran Prisma Matrix, 4.34 (sq in) (silver collagen) 3 x Per Week/15 Days ary Discharge Instructions: Moisten collagen with saline or hydrogel Prim Dressing: NPWT ary 3 x Per Week/15 Days [name] ([id number]) [id number] Page 4 of 10 Electronic Signature(s) Signed: 02/03/2023 12:33:23 PM By: Cory Guess MD FACS Entered By: [name] on 02/03/2023 11:59:15 Prescription 02/03/2023 -------------------------------------------------------------------------------- [name] Cory Guess MD Patient Name: Provider: 2005-10-03 [id number] Date of Birth: NPI#: Judie Petit [id number] Sex: DEA #: [phone] Phone #: License #: UPN: Patient Address: [address] Eligha Bridegroom [facility] Wound [place], [place] [address] [address] [place], [place] [address] [phone] Allergies latex Provider's Orders Roho cushion for wheelchair - sent referral to [facility] Signature: Date(s): Electronic Signature(s) Signed: 02/03/2023 12:33:23 PM By: Cory Guess MD FACS Entered By: [name] on 02/03/2023 11:59:15 -------------------------------------------------------------------------------- Problem List Details Patient Name: Date of Service: [name], Cory RIO N L. 02/03/2023 11:15 A M Medical Record Number: [id number] Patient Account Number: [id number] Date of Birth/Sex: Treating RN: 2005/05/21 (16 y.o. 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 02/03/2023 11:15 A M Medical Record Number: [id number] Patient Account Number: [id number] Date of Birth/Sex: Treating RN: February 02, 2006 (16 y.o. 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 Around Christmas time, he developed a pressure ulcer on his right ischium. This seems to be related to the cushioning in his wheelchair. They have an appointment coming up on Wednesday to have this checked and addressed. For some reason he has been prescribed doxycycline and Flagyl and has a couple days left of this. They have just been covering the site with gauze. On his right ischial area, he has an oval wound that extends into the fat layer. There is some slough and fibrinous exudate present on the surface. There is no erythema, induration, malodor, or purulent drainage to suggest infection. 06/21/2022: The wound measured larger and deeper today. There is evidence of  pressure induced tissue injury and the surface is a bit dry with fibrinous exudate accumulation. He does not yet have a new cushion for his wheelchair. 06/30/2022: His wound is deeper again. The surface is very clean. They

## 2023-02-04 ENCOUNTER — Telehealth: Payer: Self-pay

## 2023-02-04 NOTE — Telephone Encounter (Signed)
Call received from patient's father, Marilu Favre. Father reported that, per grandmother, the patient vomited once yesterday evening. Father asked if vomiting was a possible side effect of Dalvance infusion administered yesterday.  Stated emesis episode occurred at approximately 1900, about five hours after leaving infusion center. This nurse informed the patient's father, that per our [employer] reference database, nausea is listed as a possible side effect of this medication. Father stated that the patient had no other symptoms and reported feeling fine today. This RN advised that he could inform the patient's ordering provider, Dr. Luciana Axe, of the emesis episode. Father verbalized understanding. All questions answered.  Wyvonne Lenz, RN

## 2023-02-11 ENCOUNTER — Ambulatory Visit (INDEPENDENT_AMBULATORY_CARE_PROVIDER_SITE_OTHER): Payer: Medicaid Other

## 2023-02-11 VITALS — BP 97/68 | HR 70 | Temp 97.6°F | Resp 16 | Ht <= 58 in | Wt 99.0 lb

## 2023-02-11 DIAGNOSIS — M86251 Subacute osteomyelitis, right femur: Secondary | ICD-10-CM | POA: Diagnosis not present

## 2023-02-11 DIAGNOSIS — L03119 Cellulitis of unspecified part of limb: Secondary | ICD-10-CM

## 2023-02-11 MED ORDER — DEXTROSE 5 % IV SOLN
1500.0000 mg | Freq: Once | INTRAVENOUS | Status: AC
  Administered 2023-02-11: 1500 mg via INTRAVENOUS
  Filled 2023-02-11: qty 75

## 2023-02-11 NOTE — Progress Notes (Signed)
Diagnosis: Subacute osteomyelitis of right femur   Provider:  Chilton Greathouse MD  Procedure: IV Infusion  IV Type: Peripheral, IV Location: R Antecubital  Dalvance (Dalbavancin), Dose: 1500 mg  Infusion Start Time: 0920  Infusion Stop Time: 0953  Post Infusion IV Care: Peripheral IV Discontinued  Discharge: Condition: Good, Destination: Home . AVS Declined  Performed by:  Rico Ala, LPN

## 2023-02-16 ENCOUNTER — Encounter (HOSPITAL_BASED_OUTPATIENT_CLINIC_OR_DEPARTMENT_OTHER): Payer: Medicaid Other | Attending: General Surgery | Admitting: General Surgery

## 2023-02-16 ENCOUNTER — Telehealth: Payer: Self-pay | Admitting: Emergency Medicine

## 2023-02-16 DIAGNOSIS — M86152 Other acute osteomyelitis, left femur: Secondary | ICD-10-CM | POA: Diagnosis not present

## 2023-02-16 DIAGNOSIS — L89314 Pressure ulcer of right buttock, stage 4: Secondary | ICD-10-CM | POA: Diagnosis present

## 2023-02-16 DIAGNOSIS — J4520 Mild intermittent asthma, uncomplicated: Secondary | ICD-10-CM | POA: Diagnosis not present

## 2023-02-16 DIAGNOSIS — N319 Neuromuscular dysfunction of bladder, unspecified: Secondary | ICD-10-CM | POA: Insufficient documentation

## 2023-02-16 DIAGNOSIS — K592 Neurogenic bowel, not elsewhere classified: Secondary | ICD-10-CM | POA: Diagnosis not present

## 2023-02-16 DIAGNOSIS — Q052 Lumbar spina bifida with hydrocephalus: Secondary | ICD-10-CM | POA: Diagnosis not present

## 2023-02-16 NOTE — Telephone Encounter (Signed)
Handicap placard received, placed in provider box in office to be signed

## 2023-02-16 NOTE — Telephone Encounter (Signed)
Pt's father has dropped of a parking placard for his son and it has been placed in the providers box.   Upon completion please call pt's father, Marilu Favre, for pickup: [phone]

## 2023-02-17 NOTE — Progress Notes (Signed)
WARD, BOISSONNEAULT ([id number]) [id number] Page 1 of 7 Visit Report for 02/16/2023 Arrival Information Details Patient Name: Date of Service: [name] 02/16/2023 11:15 A M Medical Record Number: [id number] Patient Account Number: [id number] Date of Birth/Sex: Treating RN: 2005-11-20 (17 y.o. [name], [name] Primary Care Antaeus Karel: Edwina Barth Other Clinician: Referring Naara Kelty: Treating Leveda Kendrix/Extender: Eilene Ghazi in Treatment: 35 Visit Information History Since Last Visit Added or deleted any medications: No Patient Arrived: Wheel Chair Any new allergies or adverse reactions: No Arrival Time: 11:51 Had a fall or experienced change in No Accompanied By: grandparents activities of daily living that may affect Transfer Assistance: None risk of falls: Patient Identification Verified: Yes Signs or symptoms of abuse/neglect since last visito No Secondary Verification Process Completed: Yes Hospitalized since last visit: No Patient Requires Transmission-Based Precautions: No Implantable device outside of the clinic excluding No Patient Has Alerts: No cellular tissue based products placed in the center since last visit: Has Dressing in Place as Prescribed: Yes Pain Present Now: No Electronic Signature(s) Signed: 02/16/2023 5:18:43 PM By: Zenaida Deed RN, BSN Entered By: Zenaida Deed on 02/16/2023 08:52:25 -------------------------------------------------------------------------------- Lower Extremity Assessment Details Patient Name: Date of Service: Perry Mount [name] 02/16/2023 11:15 A M Medical Record Number: [id number] Patient Account Number: [id number] Date of Birth/Sex: Treating RN: 07/08/2005 (17 y.o. [name] Stark Primary Care Jeramiah Mccaughey: Edwina Barth Other Clinician: Referring Mason Burleigh: Treating Lashannon Bresnan/Extender: [name] Weeks in Treatment: 35 Electronic  Signature(s) Signed: 02/16/2023 5:18:43 PM By: Zenaida Deed RN, BSN Entered By: Zenaida Deed on 02/16/2023 08:53:39 -------------------------------------------------------------------------------- Multi Wound Chart Details Patient Name: Date of Service: [name], [name] 02/16/2023 11:15 A M Medical Record Number: [id number] Patient Account Number: [id number] Date of Birth/Sex: Treating RN: 2006-03-09 (17 y.o. M) Primary Care Adisynn Suleiman: Edwina Barth Other Clinician: Referring Cory Starkel: Treating Siarra Gilkerson/Extender: Eilene Ghazi in Treatment: [address], [name] ([id number]) [id number] Page 2 of 7 Vital Signs Height(in): 60 Pulse(bpm): 65 Weight(lbs): 110 Blood Pressure(mmHg): 103/68 Body Mass Index(BMI): 21.5 Temperature(F): 97.9 Respiratory Rate(breaths/min): 18 [1:Photos:] [N/A:N/A] Right Ischium N/A N/A Wound Location: Pressure Injury N/A N/A Wounding Event: Pressure Ulcer N/A N/A Primary Etiology: Asthma, Paraplegia N/A N/A Comorbid History: 05/11/2022 N/A N/A Date Acquired: 72 N/A N/A Weeks of Treatment: Open N/A N/A Wound Status: No N/A N/A Wound Recurrence: 3.3x3.6x2 N/A N/A Measurements Stark x W x D (cm) 9.331 N/A N/A A (cm) : rea 18.661 N/A N/A Volume (cm) : -45.10% N/A N/A % Reduction in A rea: -480.30% N/A N/A % Reduction in Volume: 1 Starting Position 1 (o'clock): 6 Ending Position 1 (o'clock): 2.8 Maximum Distance 1 (cm): Yes N/A N/A Undermining: Category/Stage IV N/A N/A Classification: Medium N/A N/A Exudate A mount: Serosanguineous N/A N/A Exudate Type: red, brown N/A N/A Exudate Color: Well defined, not attached N/A N/A Wound Margin: Large (67-100%) N/A N/A Granulation A mount: Red, Pink N/A N/A Granulation Quality: Small (1-33%) N/A N/A Necrotic A mount: Fat Layer (Subcutaneous Tissue): Yes N/A N/A Exposed Structures: Fascia: No Tendon: No Muscle: No Joint:  No Bone: No None N/A N/A Epithelialization: Debridement - Selective/Open Wound N/A N/A Debridement: Pre-procedure Verification/Time Out 12:05 N/A N/A Taken: Skin/Epidermis N/A N/A Level: 9.33 N/A N/A Debridement A (sq cm): rea Curette N/A N/A Instrument: Minimum N/A N/A Bleeding: Pressure N/A N/A Hemostasis A chieved: Insensate N/A N/A Procedural Pain: Insensate N/A N/A Post Procedural Pain: Procedure was tolerated well N/A N/A Debridement  Treatment Response: 3.3x3.6x2 N/A N/A Post Debridement Measurements Stark x W x D (cm) 18.661 N/A N/A Post Debridement Volume: (cm) Category/Stage IV N/A N/A Post Debridement Stage: No Abnormalities Noted N/A N/A Periwound Skin Texture: No Abnormalities Noted N/A N/A Periwound Skin Moisture: No Abnormalities Noted N/A N/A Periwound Skin Color: No Abnormality N/A N/A Temperature: Debridement N/A N/A Procedures Performed: Negative Pressure Wound Therapy Maintenance (NPWT) Treatment Notes Electronic Signature(s) Signed: 02/16/2023 1:17:38 PM By: Duanne Guess MD FACS Entered By: Duanne Guess on 02/16/2023 10:17:38 [name] ([id number]) [id number] Page 3 of 7 -------------------------------------------------------------------------------- Multi-Disciplinary Care Plan Details Patient Name: Date of Service: [name] 02/16/2023 11:15 A M Medical Record Number: [id number] Patient Account Number: [id number] Date of Birth/Sex: Treating RN: [birth year] (17 y.o. [name] Stark Primary Care Ruhama Lehew: Edwina Barth Other Clinician: Referring Marleta Lapierre: Treating Norville Dani/Extender: Eilene Ghazi in Treatment: 35 Multidisciplinary Care Plan reviewed with physician Active Inactive Pressure Nursing Diagnoses: Knowledge deficit related to causes and risk factors for pressure ulcer development Knowledge deficit related to management of pressures  ulcers Potential for impaired tissue integrity related to pressure, friction, moisture, and shear Goals: Patient/caregiver will verbalize understanding of pressure ulcer management Date Initiated: 06/14/2022 Target Resolution Date: 03/01/2023 Goal Status: Active Interventions: Assess: immobility, friction, shearing, incontinence upon admission and as needed Assess offloading mechanisms upon admission and as needed Assess potential for pressure ulcer upon admission and as needed Treatment Activities: Patient referred for seating evaluation to ensure proper offloading : 06/14/2022 Pressure reduction/relief device ordered : 06/14/2022 Notes: Wound/Skin Impairment Nursing Diagnoses: Impaired tissue integrity Knowledge deficit related to ulceration/compromised skin integrity Goals: Patient/caregiver will verbalize understanding of skin care regimen Date Initiated: 06/14/2022 Target Resolution Date: 03/02/2023 Goal Status: Active Ulcer/skin breakdown will have a volume reduction of 30% by week 4 Date Initiated: 06/14/2022 Date Inactivated: 07/15/2022 Target Resolution Date: 09/14/2022 Unmet Reason: requires new w/c Goal Status: Unmet cushion Ulcer/skin breakdown will have a volume reduction of 50% by week 8 Date Initiated: 07/15/2022 Date Inactivated: 09/16/2022 Target Resolution Date: 08/12/2022 Goal Status: Unmet Unmet Reason: infection Interventions: Assess patient/caregiver ability to obtain necessary supplies Assess patient/caregiver ability to perform ulcer/skin care regimen upon admission and as needed Assess ulceration(s) every visit Provide education on ulcer and skin care Treatment Activities: Skin care regimen initiated : 06/14/2022 Topical wound management initiated : 06/14/2022 Notes: SAADIQ, POCHE ([id number]) [id number] Page 4 of 7 Electronic Signature(s) Signed: 02/16/2023 5:18:43 PM By: Zenaida Deed RN, BSN Entered By: Zenaida Deed on  02/16/2023 09:02:39 -------------------------------------------------------------------------------- Negative Pressure Wound Therapy Maintenance (NPWT) Details Patient Name: Date of Service: [name] 02/16/2023 11:15 A M Medical Record Number: [id number] Patient Account Number: [id number] Date of Birth/Sex: Treating RN: 02-08-2006 (16 y.o. [name] Primary Care Chandani Rogowski: Edwina Barth Other Clinician: Referring Vina Byrd: Treating Giulietta Prokop/Extender: [name] Weeks in Treatment: 35 NPWT Maintenance Performed for: Wound #1 Right Ischium Performed By: Zenaida Deed, RN Type: Medela [employer] Coverage Size (sq cm): 11.88 Pressure Type: Constant Pressure Setting: 125 mmHG Drain Type: None Primary Contact: Other : silver collagen Sponge/Dressing Type: Foam- Black Date Initiated: 08/26/2022 Dressing Removed: Yes Quantity of Sponges/Gauze Removed: 1 Canister Changed: No Canister Exudate Volume: 10 Dressing Reapplied: Yes Quantity of Sponges/Gauze Inserted: 1 Respones T Treatment: o good Days On NPWT : 175 Post Procedure Diagnosis Same as Pre-procedure Electronic Signature(s) Signed: 02/16/2023 5:18:43 PM By: Zenaida Deed RN, BSN Entered By: Zenaida Deed on 02/16/2023 09:11:04 -------------------------------------------------------------------------------- Pain  Assessment Details Patient Name: Date of Service: [name] 02/16/2023 11:15 A M Medical Record Number: [id number] Patient Account Number: [id number] Date of Birth/Sex: Treating RN: 12-13-2005 (16 y.o. [name] Primary Care Raven Furnas: Edwina Barth Other Clinician: Referring Madison Direnzo: Treating Dudley Mages/Extender: [name] Weeks in Treatment: 4 Active Problems Location of Pain Severity and Description of Pain Patient Has Paino No Site Locations Rate the pain. BRYSYN, BRANDENBERGER ([id number])  [id number] Page 5 of 7 Rate the pain. Current Pain Level: 0 Pain Management and Medication Current Pain Management: Electronic Signature(s) Signed: 02/16/2023 5:18:43 PM By: Zenaida Deed RN, BSN Entered By: Zenaida Deed on 02/16/2023 08:53:32 -------------------------------------------------------------------------------- Patient/Caregiver Education Details Patient Name: Date of Service: Perry Mount RIO [name] 10/2/2024andnbsp11:15 A M Medical Record Number: [id number] Patient Account Number: [id number] Date of Birth/Gender: Treating RN: 05-Apr-2006 (16 y.o. [name] Primary Care Physician: Edwina Barth Other Clinician: Referring Physician: Treating Physician/Extender: Eilene Ghazi in Treatment: 46 Education Assessment Education Provided To: Patient Education Topics Provided Pressure: Methods: Explain/Verbal Responses: Reinforcements needed, State content correctly Wound/Skin Impairment: Methods: Explain/Verbal Responses: Reinforcements needed, State content correctly Electronic Signature(s) Signed: 02/16/2023 5:18:43 PM By: Zenaida Deed RN, BSN Entered By: Zenaida Deed on 02/16/2023 09:03:34 -------------------------------------------------------------------------------- Wound Assessment Details Patient Name: Date of Service: [name], [name] 02/16/2023 11:15 A Trula Ore, [name] ([id number]) [id number] Page 6 of 7 Medical Record Number: [id number] Patient Account Number: [id number] Date of Birth/Sex: Treating RN: 08-24-2005 (17 y.o. [name] Primary Care Milika Ventress: Edwina Barth Other Clinician: Referring Wilbern Pennypacker: Treating Shaunna Rosetti/Extender: [name] Weeks in Treatment: 35 Wound Status Wound Number: 1 Primary Etiology: Pressure Ulcer Wound Location: Right Ischium Wound Status: Open Wounding Event: Pressure  Injury Comorbid History: Asthma, Paraplegia Date Acquired: 05/11/2022 Weeks Of Treatment: 35 Clustered Wound: No Photos Wound Measurements Length: (cm) 3.3 Width: (cm) 3.6 Depth: (cm) 2 Area: (cm) 9.331 Volume: (cm) 18.661 % Reduction in Area: -45.1% % Reduction in Volume: -480.3% Epithelialization: None Tunneling: No Undermining: Yes Starting Position (o'clock): 1 Ending Position (o'clock): 6 Maximum Distance: (cm) 2.8 Wound Description Classification: Category/Stage IV Wound Margin: Well defined, not attached Exudate Amount: Medium Exudate Type: Serosanguineous Exudate Color: red, brown Foul Odor After Cleansing: No Slough/Fibrino No Wound Bed Granulation Amount: Large (67-100%) Exposed Structure Granulation Quality: Red, Pink Fascia Exposed: No Necrotic Amount: Small (1-33%) Fat Layer (Subcutaneous Tissue) Exposed: Yes Necrotic Quality: Adherent Slough Tendon Exposed: No Muscle Exposed: No Joint Exposed: No Bone Exposed: No Periwound Skin Texture Texture Color No Abnormalities Noted: Yes No Abnormalities Noted: Yes Moisture Temperature / Pain No Abnormalities Noted: Yes Temperature: No Abnormality Electronic Signature(s) Signed: 02/16/2023 5:18:43 PM By: Zenaida Deed RN, BSN Entered By: Zenaida Deed on 02/16/2023 09:02:29 [name] ([id number]) [id number] Page 7 of 7 -------------------------------------------------------------------------------- Vitals Details Patient Name: Date of Service: [name] 02/16/2023 11:15 A M Medical Record Number: [id number] Patient Account Number: [id number] Date of Birth/Sex: Treating RN: 2005-10-18 (16 y.o. [name] Primary Care Keldon Lassen: Edwina Barth Other Clinician: Referring Lilymae Swiech: Treating Amarra Sawyer/Extender: [name] Weeks in Treatment: 35 Vital Signs Time Taken: 11:52 Temperature (F): 97.9 Height (in): 60 Pulse (bpm):  65 Weight (lbs): 110 Respiratory Rate (breaths/min): 18 Body Mass Index (BMI): 21.5 Blood Pressure (mmHg): 103/68 Reference Range: 80 - 120 mg / dl Electronic Signature(s) Signed: 02/16/2023 5:18:43 PM By: Zenaida Deed RN, BSN Entered By: Zenaida Deed on  02/16/2023 08:52:47 

## 2023-02-17 NOTE — Progress Notes (Signed)
SHONTEZ, SERMON ([id number]) [id number] Page 1 of 11 Visit Report for 02/16/2023 Chief Complaint Document Details Patient Name: Date of Service: [name] 02/16/2023 11:15 A M Medical Record Number: [id number] Patient Account Number: [id number] Date of Birth/Sex: Treating RN: [birth year] (16 y.o. M) Primary Care Provider: Edwina Barth Other Clinician: Referring Provider: Treating Provider/Extender: Eilene Ghazi in Treatment: 35 Information Obtained from: Patient Chief Complaint Patient is at the clinic for treatment of an open pressure ulcer Electronic Signature(s) Signed: 02/16/2023 1:18:25 PM By: Duanne Guess MD FACS Entered By: Duanne Guess on 02/16/2023 10:18:25 -------------------------------------------------------------------------------- Debridement Details Patient Name: Date of Service: [name], [name] 02/16/2023 11:15 A M Medical Record Number: [id number] Patient Account Number: [id number] Date of Birth/Sex: Treating RN: 04-16-2006 (16 y.o. [name] Primary Care Provider: Edwina Barth Other Clinician: Referring Provider: Treating Provider/Extender: [name] Weeks in Treatment: 35 Debridement Performed for Assessment: Wound #1 Right Ischium Performed By: Physician Duanne Guess, MD The following information was scribed by: Zenaida Deed The information was scribed for: Duanne Guess Debridement Type: Debridement Level of Consciousness (Pre-procedure): Awake and Alert Pre-procedure Verification/Time Out Yes - 12:05 Taken: Start Time: 12:06 Percent of Wound Bed Debrided: 100% T Area Debrided (cm): otal 9.33 Tissue and other material debrided: Non-Viable, Skin: Epidermis, Biofilm Level: Skin/Epidermis Debridement Description: Selective/Open Wound Instrument: Curette Bleeding: Minimum Hemostasis Achieved: Pressure Procedural Pain:  Insensate Post Procedural Pain: Insensate Response to Treatment: Procedure was tolerated well Level of Consciousness (Post- Awake and Alert procedure): Post Debridement Measurements of Total Wound Length: (cm) 3.3 Stage: Category/Stage IV Width: (cm) 3.6 Depth: (cm) 2 Volume: (cm) 18.661 Character of Wound/Ulcer Post Debridement: Improved CAMRYN, QUESINBERRY ([id number]) [id number] Page 2 of 11 Post Procedure Diagnosis Same as Pre-procedure Electronic Signature(s) Signed: 02/16/2023 4:32:07 PM By: Duanne Guess MD FACS Signed: 02/16/2023 5:18:43 PM By: Zenaida Deed RN, BSN Entered By: Zenaida Deed on 02/16/2023 09:09:59 -------------------------------------------------------------------------------- HPI Details Patient Name: Date of Service: [name], [name] 02/16/2023 11:15 A M Medical Record Number: [id number] Patient Account Number: [id number] Date of Birth/Sex: Treating RN: 2005/10/10 (16 y.o. 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 Around Christmas time, he developed a pressure ulcer on his right ischium. This seems to be related to the cushioning in his wheelchair. They have an appointment coming up on Wednesday to have this checked and addressed. For some reason he has been prescribed doxycycline and Flagyl and has a couple days left of this. They have just been covering the site with gauze. On his right ischial area, he has an oval wound that extends into the fat layer. There is some slough and fibrinous exudate present on the surface. There is no erythema, induration, malodor, or purulent drainage to suggest infection. 06/21/2022: The  wound measured larger and deeper today. There is evidence of pressure induced tissue injury and the surface is a bit dry with fibrinous exudate accumulation. He does not yet have a new cushion for his wheelchair. 06/30/2022: His wound is deeper again. The surface is very clean. They did obtain the eggcrate cushion, but did not cut out an area to offload his ulcer. 07/07/2022: His wound measured a little bit smaller today. There is  slough on the wound surface. For some reason they did not get the Iodosorb gel and have been using Iodosorb pads; I do not think these are doing as good a job of chemical debridement as the gel would. They did cut out the space in his eggcrate foam cushion to accommodate his wound and I think this is helpful. 07/15/2022: His wound is deeper today. The tissue is pale, but the wound is fairly clean. For reasons that remain unclear, his wheelchair cushion has not been replaced and his caregivers have not contacted [employer] to do anything about it. 07/20/2022: His wound is a little bit smaller. There is still a lot of undermining. He has more slough accumulation today. We ordered him a Roho cushion last week, but he has not yet received it. 3/13; patient presents for follow-up. He has been using Hydrofera Blue to the right ischium wound bed. He has no issues or complaints today. 08/05/2022: The color of the tissue in the wound bed has improved markedly. It is much more beefy and red, whereas previously it has been quite pale. There is some slough on the wound surface. He finally got his custom molded wheelchair cushion for his school chair and a Roho cushion for home. There was an error on the part of the DME company for his low-air-loss mattress bed, but this is in the process of being resolved. 08/26/2022: The wound is measuring a little deeper; it looks like some fat simply separated in the center of his wound, rather than worsening of the pressure injury. The quality of the tissue  continues to improve. His low-air-loss mattress was finally delivered. He has his wound VAC with him for application today. 09/02/2022: The wound continues to worsen. I can palpate his trochanter in the deepest part of the wound, although the bone is not exposed. There is a strong odor coming from the wound and the tissue surface is gray. 09/09/2022: The progression of the wound seems to have been arrested. The color is markedly improved and is now beefy red. There are still some areas of the tissue that are more purpleish, suggesting ongoing pressure induced injury. The odor has abated. The culture that I took produced a polymicrobial population including Proteus, Pseudomonas, and multiple other species. He is currently taking levofloxacin and metronidazole. 09/16/2022: The wound continues to improve. The color and is red and the surface is appropriately moist. He has been in his Roho cushion and I do not see any areas of further pressure induced injury. He is completing his course of oral levofloxacin and Flagyl. 09/23/2022: No pressure induced tissue damage this week. There is no odor coming from the wound. No significant changes to the wound dimensions, however. 09/29/2022: The wound size remains about the same. The tissues are looking a bit healthier. There is slough on the wound surface. 10/07/2022: The wound is looking better. The undermining at 12:00 has closed down. The tunneling that extends over the trochanter is still present, but is tighter and narrower. The tissues look healthier. There is an odor, however, on the dressing. 10/14/2022: There has been very nice improvement in the wound over the past week. The tunneling of the trochanter is shorter by a good half centimeter and the tissue is much tighter. Near 12:00 the wound is nearly flush with the surrounding skin surface. The odor has resolved. 10/25/2022: He had his wound VAC off for graduation and there has been some breakdown over the  trochanter. The bone is not yet exposed,  but the tissue is thinner. There is a layer of slough on the wound surface. JHADEN, PIZZUTO ([id number]) [id number] Page 3 of 11 11/03/2022: There has been nice improvement since our last visit. The tissue over the trochanter has filled again and the space is contracting. The granulation tissue appears healthy and there is no slough. No evidence of ongoing pressure-induced tissue injury. 11/09/2022: The wound continues to improve. The undermined area is filling in more, but the tissue over the trochanter is still quite thin. There is minimal biofilm on the surface and no evidence of pressure induced tissue injury today. 11/16/2022: The undermined portion of the wound has filled in even more and the tissue over the trochanter is starting to get a bit thicker. Everything is extremely clean today without any odor or slough accumulation. 11/22/2022: The undermined area of the wound continues to contract. The wound is very clean. The tissue is robust and beefy red. 12/14/2022: The undermined area has come in by almost a cm. The tissue over the trochanter feels thicker. The wound is clean without any odor. 01/03/2023: The wound is stable, but there is some purpleish discoloration directly over the ischium consistent with pressure in this area. He has a little bit of slough accumulation around the edges of the wound. There is also an odor to the wound, despite use of topical gentamicin and mupirocin. 01/19/2023: The wound has filled in and the cavity is not as deep. The area overlying the trochanter is closing in. I do not appreciate any pressure-related tissue injury. There is some slough on the surface. The odor has abated. He is currently still taking the Augmentin I prescribed in response to his culture data. The x- ray that was done was concerning for possible osteomyelitis and he has had an MRI, but this has not yet been read. His albumin is  normal. The referral to plastic surgery is on hold while we complete this testing and I suspect he will require treatment for osteomyelitis before they will agree to see him. 02/03/2023: The wound continues to contract. No pressure-related tissue injury. No odor. He saw infectious disease yesterday for osteomyelitis and will receive Dalvance infusions on a weekly basis. He does have an appointment to see plastics at [facility] coming up on the 26th of this month. 02/16/2023: There is senescent skin that has built up around the wound edges and some biofilm on the surface. The area overlying the trochanter has gotten more closed in and tighter. The external wound dimensions are about the same. He has completed his Dalvance treatment. When he saw plastic surgery last week, they recommended pressure mapping and intervention with different offloading including a new seat cushion, a reclining wheelchair to help a lot of the ischium and to decrease the amount of time he spends in his wheelchair. He was advised to spend most of his time lying in bed. He was advised to offload the area completely for 3 months and allow the [facility] more time to work. Electronic Signature(s) Signed: 02/16/2023 1:20:36 PM By: Duanne Guess MD FACS Entered By: Duanne Guess on 02/16/2023 10:20:36 -------------------------------------------------------------------------------- Physical Exam Details Patient Name: Date of Service: [name] RIO N L. 02/16/2023 11:15 A M Medical Record Number: [id number] Patient Account Number: [id number] Date of Birth/Sex: Treating RN: February 22, 2006 (16 y.o. M) Primary Care Provider: Edwina Barth Other Clinician: Referring Provider: Treating Provider/Extender: [name] Weeks in Treatment: 35 Constitutional . . . . no acute distress. Respiratory Normal work of  breathing on room air. Notes 02/16/2023: There is senescent skin that has built up around the wound edges and  some biofilm on the surface. The area overlying the trochanter has gotten more closed in and tighter. The external wound dimensions are about the same. Electronic Signature(s) Signed: 02/16/2023 1:21:20 PM By: Duanne Guess MD FACS Entered By: Duanne Guess on 02/16/2023 10:21:19 -------------------------------------------------------------------------------- Physician Orders Details Patient Name: Date of Service: [name], [name] 02/16/2023 11:15 A M Medical Record Number: [id number] Patient Account Number: [id number] Date of Birth/Sex: Treating RN: 01-14-2006 (16 y.o. 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 - Dr. Lady Gary RM 1 Return A Wed 10/16 @ 12:30 pm Anesthetic Wound #1 Right Ischium (In clinic) Topical Lidocaine 4% applied to wound bed Bathing/ Shower/ Hygiene May shower and wash wound with soap and water. Negative Presssure Wound Therapy Medela Wound Vac continuously at 158mm/hg Black Foam Off-Loading Low air-loss mattress (Group 2) - ordered through [employer] Roho cushion for wheelchair - sent referral to [employer] Turn and reposition every 2 hours - use arms to lift up off the wheelchair at least hourly while up in  wheelchair Additional Orders / Instructions Follow Nutritious Diet - 70- 100 gms of protein per day. add protein drink such as Premeir Protein 2 times per day. Use the Juven for added protein intake. Wound Treatment Wound #1 - Ischium Wound Laterality: Right Cleanser: Soap and Water 3 x Per Week/15 Days Discharge Instructions: May shower and wash wound with dial antibacterial soap and water prior to dressing change. Cleanser: Vashe 5.8 (oz) 3 x Per Week/15 Days Discharge Instructions: Cleanse the wound with Vashe prior to applying a clean dressing using gauze sponges, not tissue or cotton balls. Peri-Wound Care: Skin Prep 3 x Per Week/15 Days Discharge Instructions: Use skin prep as directed Prim Dressing: Promogran Prisma Matrix, 4.34 (sq in) (silver collagen) 3 x Per Week/15 Days ary Discharge Instructions: Moisten collagen with saline or hydrogel Prim Dressing: NPWT ary 3 x Per Week/15 Days Electronic Signature(s) Signed: 02/16/2023 4:32:07 PM By: Duanne Guess MD FACS Entered By: Duanne Guess on 02/16/2023 10:21:36 Prescription 02/16/2023 [name] ([id number]) [id number] Page 5 of 11 -------------------------------------------------------------------------------- Valentina Shaggy, Logon L. Duanne Guess MD Patient Name: Provider: 2005-07-03 [id number] Date of Birth: NPI#: Judie Petit [id number] Sex: DEA #: [phone] 2012 Phone #: License #: UPN: Patient Address: [address] Eligha Bridegroom [facility] [place], [place] [address] [address] [place], [place] [address] [phone] Allergies latex Provider's Orders Roho cushion for wheelchair - sent referral to [employer] Hand Signature: Date(s): Electronic Signature(s) Signed: 02/16/2023 4:32:07 PM By: Duanne Guess MD FACS Entered By: Duanne Guess on 02/16/2023  10:21:37 -------------------------------------------------------------------------------- Problem List Details Patient Name: Date of Service: [name], [name] 02/16/2023 11:15 A M Medical Record Number: [id number] Patient Account Number: [id number] Date of Birth/Sex: Treating RN: 13-Apr-2006 (16 y.o. 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 02/16/2023 11:15 A M Medical Record Number: [id number] Patient Account Number: [id number] Date of Birth/Sex: Treating RN: 2006/01/16 (16 y.o. M) Primary Care Provider: Edwina Barth Other Clinician: Referring Provider: Treating Provider/Extender: [name] Weeks in Treatment: 35 Subjective Chief Complaint Information obtained from Patient Patient is at the clinic for treatment of an open pressure ulcer History of Present Illness (HPI) ADMISSION 06/14/2022 This is an 17 year old young man with lumbar spina  bifida, followed primarily at [facility] in their spina bifida clinic. Around Christmas time, he developed a pressure ulcer on his right ischium. This seems to be related to the cushioning in his wheelchair. They have an appointment coming up on Wednesday to have this checked and addressed. For some reason he has been prescribed doxycycline and Flagyl and has a couple days left of this. They have just been covering the site with gauze. On his right ischial area, he has an oval wound that extends into the fat layer. There is some slough and fibrinous exudate present on the surface. There is no erythema, induration, malodor, or purulent drainage to suggest infection. 06/21/2022: The wound measured larger and deeper today. There is evidence of pressure induced tissue injury and the surface is a bit dry with fibrinous exudate accumulation. He does not yet have a new cushion for his wheelchair. 06/30/2022: His wound is deeper again. The surface is very clean. They did obtain the eggcrate cushion, but did not cut out an area to offload his ulcer. 07/07/2022: His wound measured a little bit smaller today. There is slough on the wound surface. For some reason they did not get the Iodosorb gel and have been using Iodosorb pads; I do not think these are doing as good a job of chemical debridement as the gel would. They did cut out the space in his eggcrate foam cushion to accommodate his wound and I think this is helpful. 07/15/2022: His wound is deeper today. The tissue is pale, but the wound is fairly clean. For reasons that remain unclear, his wheelchair cushion has not been replaced and his caregivers have not contacted [employer] to do anything about it. 07/20/2022: His wound is a little bit smaller. There is still a lot of undermining. He has more slough accumulation today. We ordered him a Roho cushion last week, but he has not yet received it. 3/13; patient presents for follow-up. He has been using Hydrofera Blue to  the right ischium wound bed. He has no issues or complaints today. 08/05/2022: The color of the tissue in the wound bed has improved markedly. It is much more beefy and red, whereas previously it has been quite pale. There is some slough on the wound surface. He finally got his custom molded wheelchair cushion for his  school chair and a Roho cushion for home. There was an error on the part of the DME company for his low-air-loss mattress bed, but this is in the process of being resolved. 08/26/2022: The wound is measuring a little deeper; it looks like some fat simply separated in the center of his wound, rather than worsening of the pressure injury. The quality of the tissue continues to improve. His low-air-loss mattress was finally delivered. He has his wound VAC with him for application today. 09/02/2022: The wound continues to worsen. I can palpate his trochanter in the deepest part of the wound, although the bone is not exposed. There is a strong odor coming from the wound and the tissue surface is gray. 09/09/2022: The progression of the wound seems to have been arrested. The color is markedly improved and is now beefy red. There are still some areas of the tissue that are more purpleish, suggesting ongoing pressure induced injury. The odor has abated. The culture that I took produced a polymicrobial population including Proteus, Pseudomonas, and multiple other species. He is currently taking levofloxacin and metronidazole. 09/16/2022: The wound continues to improve. The color and is red and the surface is appropriately moist. He has been in his Roho cushion and I do not see any areas of further pressure induced injury. He is completing his course of oral levofloxacin and Flagyl. 09/23/2022: No pressure induced tissue damage this week. There is no odor coming from the wound. No significant changes to the wound dimensions, however. 09/29/2022: The wound size remains about the same. The tissues are looking  a bit healthier. There is slough on the wound surface. 10/07/2022: The wound is looking better. The undermining at 12:00 has closed down. The tunneling that extends over the trochanter is still present, but is tighter and narrower. The tissues look healthier. There is an odor, however, on the dressing. 10/14/2022: There has been very nice improvement in the wound over the past week. The tunneling of the trochanter is shorter by a good half centimeter and the tissue is much tighter. Near 12:00 the wound is nearly flush with the surrounding skin surface. The odor has resolved. ULRICK, METHOT ([id number]) [id number] Page 7 of 11 10/25/2022: He had his wound VAC off for graduation and there has been some breakdown over the trochanter. The bone is not yet exposed, but the tissue is thinner. There is a layer of slough on the wound surface. 11/03/2022: There has been nice improvement since our last visit. The tissue over the trochanter has filled again and the space is contracting. The granulation tissue appears healthy and there is no slough. No evidence of ongoing pressure-induced tissue injury. 11/09/2022: The wound continues to improve. The undermined area is filling in more, but the tissue over the trochanter is still quite thin. There is minimal biofilm on the surface and no evidence of pressure induced tissue injury today. 11/16/2022: The undermined portion of the wound has filled in even more and the tissue over the trochanter is starting to get a bit thicker. Everything is extremely clean today without any odor or slough accumulation. 11/22/2022: The undermined area of the wound continues to contract. The wound is very clean. The tissue is robust and beefy red. 12/14/2022: The undermined area has come in by almost a cm. The tissue over the trochanter feels thicker. The wound is clean without any odor. 01/03/2023: The wound is stable, but there is some purpleish discoloration  directly over the ischium consistent with pressure in  this area. He has a little bit of slough accumulation around the edges of the wound. There is also an odor to the wound, despite use of topical gentamicin and mupirocin. 01/19/2023: The wound has filled in and the cavity is not as deep. The area overlying the trochanter is closing in. I do not appreciate any pressure-related tissue injury. There is some slough on the surface. The odor has abated. He is currently still taking the Augmentin I prescribed in response to his culture data. The x- ray that was done was concerning for possible osteomyelitis and he has had an MRI, but this has not yet been read. His albumin is normal. The referral to plastic surgery is on hold while we complete this testing and I suspect he will require treatment for osteomyelitis before they will agree to see him. 02/03/2023: The wound continues to contract. No pressure-related tissue injury. No odor. He saw infectious disease yesterday for osteomyelitis and will receive Dalvance infusions on a weekly basis. He does have an appointment to see plastics at [facility] coming up on the 26th of this month. 02/16/2023: There is senescent skin that has built up around the wound edges and some biofilm on the surface. The area overlying the trochanter has gotten more closed in and tighter. The external wound dimensions are about the same. He has completed his Dalvance treatment. When he saw plastic surgery last week, they recommended pressure mapping and intervention with different offloading including a new seat cushion, a reclining wheelchair to help a lot of the ischium and to decrease the amount of time he spends in his wheelchair. He was advised to spend most of his time lying in bed. He was advised to offload the area completely for 3 months and allow the [facility] more time to work. Patient History Information obtained from Caregiver, Chart. Family History Kidney Disease - Father, No  family history of Cancer, Diabetes, Heart Disease, Hereditary Spherocytosis, Hypertension, Lung Disease, Seizures, Stroke, Thyroid Problems, Tuberculosis. Social History Never smoker, Marital Status - Single, Alcohol Use - Never, Drug Use - No History, Caffeine Use - Moderate. Medical History Eyes Denies history of Cataracts, Glaucoma, Optic Neuritis Ear/Nose/Mouth/Throat Denies history of Chronic sinus problems/congestion, Middle ear problems Respiratory Patient has history of Asthma - mild Endocrine Denies history of Type I Diabetes, Type II Diabetes Genitourinary Denies history of End Stage Renal Disease Integumentary (Skin) Denies history of History of Burn Neurologic Patient has history of Paraplegia Oncologic Denies history of Received Chemotherapy, Received Radiation Psychiatric Denies history of Anorexia/bulimia, Confinement Anxiety Hospitalization/Surgery History - myringotomy. - hip surgery. - ventriculoperitoneal shunt. Medical A Surgical History Notes nd Ear/Nose/Mouth/Throat allergic rhinitis Gastrointestinal ostomy, bowel program Genitourinary neurogenic bladder Integumentary (Skin) eczema Musculoskeletal contracture of left hip, spinabifida Neurologic developmental delay, loloprosencephaly, hydrocephalus ROCKNE, DEARINGER ([id number]) [id number] Page 8 of 11 Objective Constitutional no acute distress. Vitals Time Taken: 11:52 AM, Height: 60 in, Weight: 110 lbs, BMI: 21.5, Temperature: 97.9 F, Pulse: 65 bpm, Respiratory Rate: 18 breaths/min, Blood Pressure: 103/68 mmHg. Respiratory Normal work of breathing on room air. General Notes: 02/16/2023: There is senescent skin that has built up around the wound edges and some biofilm on the surface. The area overlying the trochanter has gotten more closed in and tighter. The external wound dimensions are about the same. Integumentary (Hair, Skin) Wound #1 status is Open. Original cause  of wound was Pressure Injury. The date acquired was: 05/11/2022. The wound has been in treatment 35 weeks. The wound is located on  the Right Ischium. The wound measures 3.3cm length x 3.6cm width x 2cm depth; 9.331cm^2 area and 18.661cm^3 volume. There is Fat Layer (Subcutaneous Tissue) exposed. There is no tunneling noted, however, there is undermining starting at 1:00 and ending at 6:00 with a maximum distance of 2.8cm. There is a medium amount of serosanguineous drainage noted. The wound margin is well defined and not attached to the wound base. There is large (67-100%) red, pink granulation within the wound bed. There is a small (1-33%) amount of necrotic tissue within the wound bed including Adherent Slough. The periwound skin appearance had no abnormalities noted for texture. The periwound skin appearance had no abnormalities noted for moisture. The periwound skin appearance had no abnormalities noted for color. Periwound temperature was noted as No Abnormality. 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 There was a Selective/Open Wound Skin/Epidermis Debridement with a total area of 9.33 sq cm performed by Duanne Guess, MD. With the following instrument(s): Curette to remove Non-Viable tissue/material. Material removed includes Skin: Epidermis and Biofilm and. No specimens were taken. A time out was conducted at 12:05, prior to the start of the procedure. A Minimum amount of bleeding was controlled with Pressure. The procedure was tolerated well with a pain level of Insensate throughout and a pain level of Insensate following the procedure.  Post Debridement Measurements: 3.3cm length x 3.6cm width x 2cm depth; 18.661cm^3 volume. Post debridement Stage noted as Category/Stage IV. Character of Wound/Ulcer Post Debridement is improved. Post procedure Diagnosis Wound #1: Same as Pre-Procedure Plan Follow-up Appointments: Return Appointment in 2 weeks. - Dr. Lady Gary RM 1 Wed 10/16 @ 12:30 pm Anesthetic: Wound #1 Right Ischium: (In clinic) Topical Lidocaine 4% applied to wound bed Bathing/ Shower/ Hygiene: May shower and wash wound with soap and water. Negative Presssure Wound Therapy: Medela Wound Vac continuously at 123mm/hg Black Foam Off-Loading: Low air-loss mattress (Group 2) - ordered through [employer] Roho cushion for wheelchair - sent referral to [employer] and reposition every 2 hours - use arms to lift up off the wheelchair at least hourly while up in wheelchair Additional Orders / Instructions: Follow Nutritious Diet - 70- 100 gms of protein per day. add protein drink such as Premeir Protein 2 times per day. Use the Juven for added protein intake. WOUND #1: - Ischium Wound Laterality: Right Cleanser: Soap and Water 3 x Per Week/15 Days Discharge Instructions: May shower and wash wound with dial antibacterial soap and water prior to dressing change. Cleanser: Vashe 5.8 (oz) 3 x Per Week/15 Days Discharge Instructions: Cleanse the wound with Vashe prior to applying a clean dressing using gauze sponges, not tissue or cotton balls. Peri-Wound Care: Skin Prep 3 x Per Week/15 Days Discharge Instructions: Use skin prep as directed THORNTON, DOHRMANN ([id number]) [id number] Page 9 of 11 Prim Dressing: Promogran Prisma Matrix, 4.34 (sq in) (silver collagen) 3 x Per Week/15 Days ary Discharge Instructions: Moisten collagen with saline or hydrogel Prim Dressing: NPWT 3 x Per Week/15 Days ary 02/16/2023: There is senescent skin that has built up around the wound edges and some biofilm on the  surface. The area overlying the trochanter has gotten more closed in and tighter. The external wound dimensions are about the same.  He has completed his Dalvance treatment. When he saw plastic surgery last week, they recommended pressure mapping and intervention with different offloading including a new seat cushion, a reclining wheelchair to help a lot of the ischium and to decrease the amount of time he spends in his wheelchair. He was advised to spend most of his time lying in bed. He was advised to offload the area completely for 3 months and allow the [facility] more time to work. I used a curette to debride the senescent skin from around the wound edges and the biofilm off of the wound surface. We will continue Prisma silver collagen with negative pressure wound therapy. He apparently has follow-up in plastics in about 3 months; he needs to adhere to their pressure offloading recommendations. Follow-up in 2 weeks. Electronic Signature(s) Signed: 02/16/2023 1:22:28 PM By: Duanne Guess MD FACS Entered By: Duanne Guess on 02/16/2023 10:22:28 -------------------------------------------------------------------------------- HxROS Details Patient Name: Date of Service: [name], [name] 02/16/2023 11:15 A M Medical Record Number: [id number] Patient Account Number: [id number] Date of Birth/Sex: Treating RN: 08-25-2005 (16 y.o. M) Primary Care Provider: Edwina Barth Other Clinician: Referring Provider: Treating Provider/Extender: Eilene Ghazi in Treatment: 35 Information Obtained From Caregiver Chart Eyes Medical History: Negative for: Cataracts; Glaucoma; Optic Neuritis Ear/Nose/Mouth/Throat Medical History: Negative for: Chronic sinus problems/congestion; Middle ear problems Past Medical History Notes: allergic rhinitis Respiratory Medical History: Positive for: Asthma - mild Gastrointestinal Medical History: Past Medical History Notes: ostomy,  bowel program Endocrine Medical History: Negative for: Type I Diabetes; Type II Diabetes Genitourinary Medical History: Negative for: End Stage Renal Disease Past Medical History Notes: neurogenic bladder Integumentary (Skin) DAVIED, NOCITO ([id number]) [id number] Page 10 of 11 Medical History: Negative for: History of Burn Past Medical History Notes: eczema Musculoskeletal Medical History: Past Medical History Notes: contracture of left hip, spinabifida Neurologic Medical History: 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, loloprosencephaly, hydrocephalus Oncologic Medical History: Negative for: Received Chemotherapy; Received Radiation Psychiatric Medical History: Negative for: Anorexia/bulimia; Confinement Anxiety Immunizations Pneumococcal Vaccine: Received Pneumococcal Vaccination: No Implantable Devices No devices added Hospitalization / Surgery History Type of Hospitalization/Surgery myringotomy hip surgery ventriculoperitoneal shunt Family and Social History Cancer: No; Diabetes: No; Heart Disease: No; Hereditary Spherocytosis: No; Hypertension: No; Kidney Disease: Yes - Father; Lung Disease: No; Seizures: No; Stroke: No; Thyroid Problems: No; Tuberculosis: No; Never smoker; Marital Status - Single; Alcohol Use: Never; Drug Use: No History; Caffeine Use: Moderate; Financial Concerns: No; Food, Clothing or Shelter Needs: No; Support System Lacking: No; Transportation Concerns: No Electronic Signature(s) Signed: 02/16/2023 4:32:07 PM By: Duanne Guess MD FACS Entered By: Duanne Guess on 02/16/2023 10:20:42 -------------------------------------------------------------------------------- SuperBill Details Patient Name: Date of Service: [name], [name] 02/16/2023 Medical Record Number: [id number] Patient Account Number: [id number] Date of Birth/Sex: Treating RN: 25-Aug-2005 (16 y.o.  M) Primary Care Provider: Edwina Barth Other Clinician: Referring Provider: Treating Provider/Extender: [name] Weeks in Treatment: 35 Diagnosis Coding ICD-10 Codes Code Description L89.314 Pressure ulcer of right buttock, stage 4 M86.152 Other acute osteomyelitis, left femur MAKAIO, MACH ([id number]) [id number] Page 11 of 11 Q05.2 Lumbar spina bifida with hydrocephalus K59.2 Neurogenic bowel, not elsewhere classified N31.9 Neuromuscular dysfunction of bladder, unspecified J45.20 Mild intermittent asthma, uncomplicated Facility Procedures : CPT4 Code: 35573220 9 Description: 7597 - DEBRIDE WOUND 1ST 20 SQ CM OR < ICD-10 Diagnosis Description L89.314 Pressure ulcer of right buttock, stage 4 Modifier: Quantity: 1 : CPT4 Code: [id number] [id number]  Description: 7605 - WOUND VAC-50 SQ CM OR LESS Modifier: Quantity: 1 Physician Procedures : CPT4 Code Description Modifier [id number] - WC PHYS LEVEL 4 - EST PT 25 ICD-10 Diagnosis Description L89.314 Pressure ulcer of right buttock, stage 4 M86.152 Other acute osteomyelitis, left femur Q05.2 Lumbar spina bifida with hydrocephalus K59.2  Neurogenic bowel, not elsewhere classified Quantity: 1 : [id number] - WC PHYS DEBR WO ANESTH 20 SQ CM ICD-10 Diagnosis Description L89.314 Pressure ulcer of right buttock, stage 4 Quantity: 1 Electronic Signature(s) Signed: 02/16/2023 1:22:52 PM By: Duanne Guess MD FACS Entered By: Duanne Guess on 02/16/2023 10:22:52

## 2023-02-18 NOTE — Telephone Encounter (Signed)
Called patient father and informed him that the handicap placard is completed, will have form upfront

## 2023-02-21 ENCOUNTER — Telehealth: Payer: Self-pay | Admitting: Emergency Medicine

## 2023-02-21 NOTE — Telephone Encounter (Signed)
Pt mother states that this paperwork will be picked up today 10.07.2024

## 2023-02-24 ENCOUNTER — Encounter: Payer: Self-pay | Admitting: Internal Medicine

## 2023-02-24 ENCOUNTER — Other Ambulatory Visit: Payer: Self-pay

## 2023-02-24 ENCOUNTER — Telehealth: Payer: Medicaid Other | Admitting: Internal Medicine

## 2023-02-24 DIAGNOSIS — M86251 Subacute osteomyelitis, right femur: Secondary | ICD-10-CM | POA: Diagnosis present

## 2023-02-24 NOTE — Assessment & Plan Note (Signed)
Now s/p treatment with dalbavancin and healing well now.   No further antibiotics indicated at this time Continue with wound care; he is optimized with protein.   Follow up as needed.

## 2023-02-24 NOTE — Progress Notes (Signed)
   Subjective:    Patient ID: [name], male    DOB: 03-04-2006, 17 y.o.   MRN: [id number]  I connected with  Kipp Arthor Captain on 02/24/23 by a video enabled telemedicine application and verified that I am speaking with the correct person using two identifiers.   I discussed the limitations of evaluation and management by telemedicine. The patient expressed understanding and agreed to proceed.  Location: Patient - home Physician - clinic  Duration of visit: 17 minutes   HPI [name] is seen for follow up of osteomyelitis.  He developed osteomyelitis of his proximal right femur at the site of his large decubitus ulcer and treated with 2 doses of dalbavancin since his initial visit  Since then his wound is healing, closing and filling in per the notes from Dr. Lady Gary and from the patient.  He has a wound VAC in place now.  No rash or diarrhea associated with the infusions.   No concerns.     Review of Systems  Constitutional:  Negative for chills, fatigue and fever.  Gastrointestinal:  Negative for diarrhea.       Objective:   Physical Exam Neurological:     Mental Status: He is alert.           Assessment & Plan:

## 2023-03-01 ENCOUNTER — Encounter (HOSPITAL_BASED_OUTPATIENT_CLINIC_OR_DEPARTMENT_OTHER): Payer: Medicaid Other | Admitting: General Surgery

## 2023-03-01 DIAGNOSIS — L89314 Pressure ulcer of right buttock, stage 4: Secondary | ICD-10-CM | POA: Diagnosis not present

## 2023-03-01 NOTE — Progress Notes (Addendum)
JAHMAR, MCKELVY ([id number]) [id number] Page 1 of 10 Visit Report for 03/01/2023 Chief Complaint Document Details Patient Name: Date of Service: [name] 03/01/2023 12:45 PM Medical Record Number: [id number] Patient Account Number: [id number] Date of Birth/Sex: Treating RN: 08/07/2005 (16 y.o. M) Primary Care Provider: Edwina Barth Other Clinician: Referring Provider: Treating Provider/Extender: Eilene Ghazi in Treatment: 37 Information Obtained from: Patient Chief Complaint Patient is at the clinic for treatment of an open pressure ulcer Electronic Signature(s) Signed: 03/01/2023 12:59:10 PM By: Duanne Guess MD FACS Entered By: Duanne Guess on 03/01/2023 12:59:10 -------------------------------------------------------------------------------- HPI Details Patient Name: Date of Service: [name], Cory RIO N L. 03/01/2023 12:45 PM Medical Record Number: [id number] Patient Account Number: [id number] Date of Birth/Sex: Treating RN: 06-10-2005 (16 y.o. 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 Around Christmas time, he developed a pressure ulcer on his right ischium. This seems to be related to the cushioning in his wheelchair. They have an appointment coming up on Wednesday to have this checked and addressed. For some reason he has been prescribed doxycycline and Flagyl and has a couple days left of this. They have just been covering the site with gauze. On his right ischial area, he has an oval wound that extends into the fat layer. There is some slough and fibrinous exudate present on the surface. There is  no erythema, induration, malodor, or purulent drainage to suggest infection. 06/21/2022: The wound measured larger and deeper today. There is evidence of pressure induced tissue injury and the surface is a bit dry with fibrinous exudate accumulation. He does not yet have a new cushion for his wheelchair. 06/30/2022: His wound is deeper again. The surface is very clean. They did obtain the eggcrate cushion, but did not cut out an area to offload his ulcer. 07/07/2022: His wound measured a little bit smaller today. There is slough on the wound surface. For some reason they did not get the Iodosorb gel and have been using Iodosorb pads; I do not think these are doing as good a job of chemical debridement as the gel would. They did cut out the space in his eggcrate foam cushion to accommodate his wound and I think this is helpful. 07/15/2022: His wound is deeper today. The tissue is pale, but the wound is fairly clean. For reasons that remain unclear, his wheelchair cushion has not been replaced and his caregivers have not contacted [employer] to do anything about it. 07/20/2022: His wound is a little bit smaller. There is still a lot of undermining. He has more slough accumulation today. We ordered him a Roho cushion last week, but he has not yet received it. 3/13; patient presents for follow-up. He has been using Hydrofera Blue to the right ischium wound bed. He has no issues or complaints today. 08/05/2022: The color of the tissue in the wound bed has improved markedly. It is much more beefy and red, whereas previously it has been quite pale. There is some slough on the wound surface. He finally got his custom molded wheelchair cushion for his school chair and a Roho cushion for home. There was an error on the part of the DME company for his low-air-loss mattress bed, but this is in the  process of being resolved. [name], [name] ([id number]) [id number] Page 2 of 10 08/26/2022: The  wound is measuring a little deeper; it looks like some fat simply separated in the center of his wound, rather than worsening of the pressure injury. The quality of the tissue continues to improve. His low-air-loss mattress was finally delivered. He has his wound VAC with him for application today. 09/02/2022: The wound continues to worsen. I can palpate his trochanter in the deepest part of the wound, although the bone is not exposed. There is a strong odor coming from the wound and the tissue surface is gray. 09/09/2022: The progression of the wound seems to have been arrested. The color is markedly improved and is now beefy red. There are still some areas of the tissue that are more purpleish, suggesting ongoing pressure induced injury. The odor has abated. The culture that I took produced a polymicrobial population including Proteus, Pseudomonas, and multiple other species. He is currently taking levofloxacin and metronidazole. 09/16/2022: The wound continues to improve. The color and is red and the surface is appropriately moist. He has been in his Roho cushion and I do not see any areas of further pressure induced injury. He is completing his course of oral levofloxacin and Flagyl. 09/23/2022: No pressure induced tissue damage this week. There is no odor coming from the wound. No significant changes to the wound dimensions, however. 09/29/2022: The wound size remains about the same. The tissues are looking a bit healthier. There is slough on the wound surface. 10/07/2022: The wound is looking better. The undermining at 12:00 has closed down. The tunneling that extends over the trochanter is still present, but is tighter and narrower. The tissues look healthier. There is an odor, however, on the dressing. 10/14/2022: There has been very nice improvement in the wound over the past week. The tunneling of the trochanter is shorter by a good half centimeter and the tissue is much tighter. Near 12:00 the wound  is nearly flush with the surrounding skin surface. The odor has resolved. 10/25/2022: He had his wound VAC off for graduation and there has been some breakdown over the trochanter. The bone is not yet exposed, but the tissue is thinner. There is a layer of slough on the wound surface. 11/03/2022: There has been nice improvement since our last visit. The tissue over the trochanter has filled again and the space is contracting. The granulation tissue appears healthy and there is no slough. No evidence of ongoing pressure-induced tissue injury. 11/09/2022: The wound continues to improve. The undermined area is filling in more, but the tissue over the trochanter is still quite thin. There is minimal biofilm on the surface and no evidence of pressure induced tissue injury today. 11/16/2022: The undermined portion of the wound has filled in even more and the tissue over the trochanter is starting to get a bit thicker. Everything is extremely clean today without any odor or slough accumulation. 11/22/2022: The undermined area of the wound continues to contract. The wound is very clean. The tissue is robust and beefy red. 12/14/2022: The undermined area has come in by almost a cm. The tissue over the trochanter feels thicker. The wound is clean without any odor. 01/03/2023: The wound is stable, but there is some purpleish discoloration directly over the ischium consistent with pressure in this area. He has a little bit of slough accumulation around the edges of the wound. There is also an odor to the wound, despite use of topical gentamicin  and mupirocin. 01/19/2023: The wound has filled in and the cavity is not as deep. The area overlying the trochanter is closing in. I do not appreciate any pressure-related tissue injury. There is some slough on the surface. The odor has abated. He is currently still taking the Augmentin I prescribed in response to his culture data. The x- ray that was done was concerning for possible  osteomyelitis and he has had an MRI, but this has not yet been read. His albumin is normal. The referral to plastic surgery is on hold while we complete this testing and I suspect he will require treatment for osteomyelitis before they will agree to see him. 02/03/2023: The wound continues to contract. No pressure-related tissue injury. No odor. He saw infectious disease yesterday for osteomyelitis and will receive Dalvance infusions on a weekly basis. He does have an appointment to see plastics at [facility] coming up on the 26th of this month. 02/16/2023: There is senescent skin that has built up around the wound edges and some biofilm on the surface. The area overlying the trochanter has gotten more closed in and tighter. The external wound dimensions are about the same. He has completed his Dalvance treatment. When he saw plastic surgery last week, they recommended pressure mapping and intervention with different offloading including a new seat cushion, a reclining wheelchair to help a lot of the ischium and to decrease the amount of time he spends in his wheelchair. He was advised to spend most of his time lying in bed. He was advised to offload the area completely for 3 months and allow the [facility] more time to work. 03/01/2023: The wound is extremely clean today and is getting shallower with less undermining. The area over the trochanter is quite a bit tighter. There is no evidence of pressure induced tissue injury. Electronic Signature(s) Signed: 03/01/2023 1:00:03 PM By: Duanne Guess MD FACS Entered By: Duanne Guess on 03/01/2023 13:00:02 -------------------------------------------------------------------------------- Physical Exam Details Patient Name: Date of Service: Cory Mount RIO N L. 03/01/2023 12:45 PM Medical Record Number: [id number] Patient Account Number: [id number] Date of Birth/Sex: Treating RN: 07/22/2005 (16 y.o. M) Primary Care Provider: Edwina Barth Other  Clinician: Referring Provider: Treating Provider/Extender: [name] Weeks in Treatment: 37 Constitutional . . . . no acute distress. Respiratory Normal work of breathing on room air. [name], [name] ([id number]) [id number] Page 3 of 10 Notes 03/01/2023: The wound is extremely clean today and is getting shallower with less undermining. The area over the trochanter is quite a bit tighter. There is no evidence of pressure induced tissue injury. Electronic Signature(s) Signed: 03/01/2023 1:00:31 PM By: Duanne Guess MD FACS Entered By: Duanne Guess on 03/01/2023 13:00:31 -------------------------------------------------------------------------------- Physician Orders Details Patient Name: Date of Service: Cory Mount RIO N L. 03/01/2023 12:45 PM Medical Record Number: [id number] Patient Account Number: [id number] Date of Birth/Sex: Treating RN: 30-Nov-2005 (16 y.o. 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 - Dr. Lady Gary RM 1 Return A Wed 10/30 @  08:15 am Anesthetic Wound #1 Right Ischium (In clinic) Topical Lidocaine 4% applied to wound bed Bathing/ Shower/ Hygiene May shower and wash wound with soap and water. Negative Presssure Wound Therapy Medela Wound Vac  continuously at 134mm/hg Black Foam Off-Loading Low air-loss mattress (Group 2) - ordered through [employer] Roho cushion for wheelchair - sent referral to [employer] Turn and reposition every 2 hours - use arms to lift up off the wheelchair at least hourly while up in wheelchair Additional Orders / Instructions Follow Nutritious Diet - 70- 100 gms of protein per day. add protein drink such as Premeir Protein 2 times per day. Use the Juven for added protein intake. Wound Treatment Wound #1 - Ischium Wound Laterality: Right Cleanser: Soap and Water 3 x Per Week/15 Days Discharge Instructions: May shower and wash wound with dial antibacterial soap and water prior to dressing change. Cleanser: Vashe 5.8 (oz) 3 x Per Week/15 Days Discharge Instructions: Cleanse the wound with Vashe prior to applying a clean dressing using gauze sponges, not tissue or cotton balls. Peri-Wound Care: Skin Prep 3 x Per Week/15 Days Discharge Instructions: Use skin prep as directed [name], [name] ([id number]) [phone].pdf Page 4 of 10 Prim Dressing: Promogran Prisma Matrix, 4.34 (sq in) (silver collagen) 3 x Per Week/15 Days ary Discharge Instructions: Moisten collagen with saline or hydrogel Prim Dressing: NPWT ary 3 x Per Week/15 Days Electronic Signature(s) Signed: 03/01/2023 2:19:09 PM By: Duanne Guess MD FACS Signed: 03/01/2023 4:01:24 PM By: Zenaida Deed RN, BSN Entered By: Zenaida Deed on 03/01/2023 13:18:57 Prescription 03/01/2023 -------------------------------------------------------------------------------- [name] Duanne Guess MD Patient Name: Provider: November 01, 2005 [id number] Date of Birth: NPI#: Judie Petit [id number] Sex: DEA #: [phone] 2012 Phone #: License #: UPN: Patient Address: [address] [facility] [place], [place] [address] [address] [place], [place]  [address] [phone] Allergies latex Provider's Orders Roho cushion for wheelchair - sent referral to [employer] Hand Signature: Date(s): Electronic Signature(s) Signed: 03/01/2023 2:19:09 PM By: Duanne Guess MD FACS Signed: 03/01/2023 4:01:24 PM By: Zenaida Deed RN, BSN Entered By: Zenaida Deed on 03/01/2023 13:18:57 -------------------------------------------------------------------------------- Problem List Details Patient Name: Date of Service: Cory Mount RIO N L. 03/01/2023 12:45 PM Medical Record Number: [id number] Patient Account Number: [id number] Date of Birth/Sex: Treating RN: 2005/06/05 (16 y.o. 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 03/01/2023 12:45 PM Medical Record Number: [id number] Patient Account Number: [id number] Date of Birth/Sex: Treating RN: 04-23-2006 (16 y.o. 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 Around Christmas time, he developed a pressure ulcer on his right ischium. This seems to be related to the cushioning in his wheelchair. They have an appointment coming up on Wednesday to have this checked and addressed. For some reason he has been prescribed doxycycline and Flagyl and has a couple days left of this. They have just been covering the site with gauze. On his right ischial area, he has an oval wound that extends into the fat layer. There is some slough and fibrinous exudate present on the surface. There is no erythema, induration, malodor, or purulent drainage to suggest infection. 06/21/2022: The wound measured larger and deeper today. There is evidence of pressure induced tissue injury and the surface is a bit dry with fibrinous exudate accumulation. He does not yet have a new cushion for his wheelchair. 06/30/2022: His wound is deeper again. The surface is very clean. They did obtain the eggcrate cushion, but did not cut out an area to offload his ulcer. 07/07/2022: His wound measured a little bit smaller today. There is slough on the wound surface. For some reason they did not get the Iodosorb gel and have been using Iodosorb pads; I do not think these are doing as good a job of chemical debridement as the gel would. They did cut out the space in his eggcrate foam cushion to accommodate his wound and I think this is helpful. 07/15/2022: His wound is deeper today. The tissue is pale, but the wound is fairly clean. For reasons that remain unclear, his wheelchair cushion has not been replaced and his  caregivers have not contacted [employer] to do anything about it. 07/20/2022: His wound is a little bit smaller. There is still a lot of undermining. He has more slough accumulation today. We ordered him a Roho cushion last [name], [name] ([id number]) [phone].pdf Page 6 of 10 week, but he has not yet received it. 3/13; patient presents for follow-up. He has been using Hydrofera Blue to the right ischium wound bed. He has no issues or complaints today. 08/05/2022: The color of the tissue in the wound bed has improved markedly. It is much more beefy and red, whereas previously it has been quite pale. There is some slough on the wound surface. He finally got his custom molded wheelchair cushion for his school chair and a Roho cushion for home. There was an error on the part of the DME company for his low-air-loss mattress bed, but this is in the process of being resolved. 08/26/2022: The wound is measuring a little deeper; it looks like some fat simply separated in the center of his wound, rather than worsening of the pressure injury. The quality of the tissue continues to improve. His low-air-loss mattress was finally delivered. He has his wound VAC with him for application today. 09/02/2022: The wound continues to worsen. I can palpate his trochanter in the deepest part of the wound, although the bone is not exposed. There is a strong odor coming from the wound and the tissue surface is gray. 09/09/2022: The progression of the wound seems to have been arrested. The color is markedly improved and is now beefy red. There are still some areas of the tissue that are more purpleish, suggesting ongoing pressure induced injury. The odor has abated. The culture that I took produced a polymicrobial population including Proteus, Pseudomonas, and multiple other species. He is  currently taking levofloxacin and metronidazole. 09/16/2022: The wound continues to improve. The color and is red and  the surface is appropriately moist. He has been in his Roho cushion and I do not see any areas of further pressure induced injury. He is completing his course of oral levofloxacin and Flagyl. 09/23/2022: No pressure induced tissue damage this week. There is no odor coming from the wound. No significant changes to the wound dimensions, however. 09/29/2022: The wound size remains about the same. The tissues are looking a bit healthier. There is slough on the wound surface. 10/07/2022: The wound is looking better. The undermining at 12:00 has closed down. The tunneling that extends over the trochanter is still present, but is tighter and narrower. The tissues look healthier. There is an odor, however, on the dressing. 10/14/2022: There has been very nice improvement in the wound over the past week. The tunneling of the trochanter is shorter by a good half centimeter and the tissue is much tighter. Near 12:00 the wound is nearly flush with the surrounding skin surface. The odor has resolved. 10/25/2022: He had his wound VAC off for graduation and there has been some breakdown over the trochanter. The bone is not yet exposed, but the tissue is thinner. There is a layer of slough on the wound surface. 11/03/2022: There has been nice improvement since our last visit. The tissue over the trochanter has filled again and the space is contracting. The granulation tissue appears healthy and there is no slough. No evidence of ongoing pressure-induced tissue injury. 11/09/2022: The wound continues to improve. The undermined area is filling in more, but the tissue over the trochanter is still quite thin. There is minimal biofilm on the surface and no evidence of pressure induced tissue injury today. 11/16/2022: The undermined portion of the wound has filled in even more and the tissue over the trochanter is starting to get a bit thicker. Everything is extremely clean today without any odor or slough accumulation. 11/22/2022:  The undermined area of the wound continues to contract. The wound is very clean. The tissue is robust and beefy red. 12/14/2022: The undermined area has come in by almost a cm. The tissue over the trochanter feels thicker. The wound is clean without any odor. 01/03/2023: The wound is stable, but there is some purpleish discoloration directly over the ischium consistent with pressure in this area. He has a little bit of slough accumulation around the edges of the wound. There is also an odor to the wound, despite use of topical gentamicin and mupirocin. 01/19/2023: The wound has filled in and the cavity is not as deep. The area overlying the trochanter is closing in. I do not appreciate any pressure-related tissue injury. There is some slough on the surface. The odor has abated. He is currently still taking the Augmentin I prescribed in response to his culture data. The x- ray that was done was concerning for possible osteomyelitis and he has had an MRI, but this has not yet been read. His albumin is normal. The referral to plastic surgery is on hold while we complete this testing and I suspect he will require treatment for osteomyelitis before they will agree to see him. 02/03/2023: The wound continues to contract. No pressure-related tissue injury. No odor. He saw infectious disease yesterday for osteomyelitis and will receive Dalvance infusions on a weekly basis. He does have an appointment to see plastics at [facility] coming up on the 26th of this month. 02/16/2023: There is senescent  skin that has built up around the wound edges and some biofilm on the surface. The area overlying the trochanter has gotten more closed in and tighter. The external wound dimensions are about the same. He has completed his Dalvance treatment. When he saw plastic surgery last week, they recommended pressure mapping and intervention with different offloading including a new seat cushion, a reclining wheelchair to help a lot of  the ischium and to decrease the amount of time he spends in his wheelchair. He was advised to spend most of his time lying in bed. He was advised to offload the area completely for 3 months and allow the [facility] more time to work. 03/01/2023: The wound is extremely clean today and is getting shallower with less undermining. The area over the trochanter is quite a bit tighter. There is no evidence of pressure induced tissue injury. Patient History Information obtained from Caregiver, Chart. Family History Kidney Disease - Father, No family history of Cancer, Diabetes, Heart Disease, Hereditary Spherocytosis, Hypertension, Lung Disease, Seizures, Stroke, Thyroid Problems, Tuberculosis. Social History Never smoker, Marital Status - Single, Alcohol Use - Never, Drug Use - No History, Caffeine Use - Moderate. Medical History Eyes Denies history of Cataracts, Glaucoma, Optic Neuritis Ear/Nose/Mouth/Throat Denies history of Chronic sinus problems/congestion, Middle ear problems Respiratory Patient has history of Asthma - mild Endocrine Denies history of Type I Diabetes, Type II Diabetes Genitourinary Denies history of End Stage Renal Disease Integumentary (Skin) Denies history of History of Burn [name], [name] ([id number]) [phone].pdf Page 7 of 10 Neurologic Patient has history of Paraplegia Oncologic Denies history of Received Chemotherapy, Received Radiation Psychiatric Denies history of Anorexia/bulimia, Confinement Anxiety Hospitalization/Surgery History - myringotomy. - hip surgery. - ventriculoperitoneal shunt. Medical A Surgical History Notes nd Ear/Nose/Mouth/Throat allergic rhinitis Gastrointestinal ostomy, bowel program Genitourinary neurogenic bladder Integumentary (Skin) eczema Musculoskeletal contracture of left hip, spinabifida Neurologic developmental delay, loloprosencephaly, hydrocephalus Objective Constitutional no acute  distress. Vitals Time Taken: 12:40 PM, Height: 60 in, Weight: 110 lbs, BMI: 21.5, Temperature: 97.9 F, Pulse: 60 bpm, Respiratory Rate: 18 breaths/min, Blood Pressure: 104/66 mmHg. Respiratory Normal work of breathing on room air. General Notes: 03/01/2023: The wound is extremely clean today and is getting shallower with less undermining. The area over the trochanter is quite a bit tighter. There is no evidence of pressure induced tissue injury. Integumentary (Hair, Skin) Wound #1 status is Open. Original cause of wound was Pressure Injury. The date acquired was: 05/11/2022. The wound has been in treatment 37 weeks. The wound is located on the Right Ischium. The wound measures 3.5cm length x 3.8cm width x 1.5cm depth; 10.446cm^2 area and 15.669cm^3 volume. There is Fat Layer (Subcutaneous Tissue) exposed. There is no tunneling noted, however, there is undermining starting at 12:00 and ending at 4:00 with a maximum distance of 2.5cm. There is a medium amount of serosanguineous drainage noted. The wound margin is well defined and not attached to the wound base. There is large (67-100%) red, pink granulation within the wound bed. There is no necrotic tissue within the wound bed. The periwound skin appearance had no abnormalities noted for texture. The periwound skin appearance had no abnormalities noted for moisture. The periwound skin appearance had no abnormalities noted for color. Periwound temperature was noted as No Abnormality. Assessment Active Problems ICD-10 Pressure ulcer of right buttock, stage 4 Other acute osteomyelitis, left femur Lumbar spina bifida with hydrocephalus Neurogenic bowel, not elsewhere classified Neuromuscular dysfunction of bladder, unspecified Mild intermittent asthma, uncomplicated Plan Follow-up Appointments: Return  Appointment in 2 weeks. - Dr. Lady Gary RM 1 Wed 10/30 @ 08:15 am Anesthetic: Wound #1 Right Ischium: (In clinic) Topical Lidocaine 4% applied to  wound bed Bathing/ Shower/ Hygiene: May shower and wash wound with soap and water. Negative Presssure Wound Therapy: Medela Wound Vac continuously at 153mm/hg [name], [name] ([id number]) [id number].pdf Page 8 of 10 Black Foam Off-Loading: Low air-loss mattress (Group 2) - ordered through [employer] Roho cushion for wheelchair - sent referral to [employer] Turn and reposition every 2 hours - use arms to lift up off the wheelchair at least hourly while up in wheelchair Additional Orders / Instructions: Follow Nutritious Diet - 70- 100 gms of protein per day. add protein drink such as Premeir Protein 2 times per day. Use the Juven for added protein intake. WOUND #1: - Ischium Wound Laterality: Right Cleanser: Soap and Water 3 x Per Week/15 Days Discharge Instructions: May shower and wash wound with dial antibacterial soap and water prior to dressing change. Cleanser: Vashe 5.8 (oz) 3 x Per Week/15 Days Discharge Instructions: Cleanse the wound with Vashe prior to applying a clean dressing using gauze sponges, not tissue or cotton balls. Peri-Wound Care: Skin Prep 3 x Per Week/15 Days Discharge Instructions: Use skin prep as directed Prim Dressing: Promogran Prisma Matrix, 4.34 (sq in) (silver collagen) 3 x Per Week/15 Days ary Discharge Instructions: Moisten collagen with saline or hydrogel Prim Dressing: NPWT 3 x Per Week/15 Days ary 03/01/2023: The wound is extremely clean today and is getting shallower with less undermining. The area over the trochanter is quite a bit tighter. There is no evidence of pressure induced tissue injury. No debridement was necessary today. We will continue Prisma silver collagen at the wound base with negative pressure wound therapy. Based on the wound appearance, he does seem to be doing a better job of offloading. Follow-up in 2 weeks. Electronic Signature(s) Signed: 03/02/2023 3:02:23 PM By: Duanne Guess MD FACS Signed:  03/02/2023 3:07:10 PM By: Shawn Stall RN, BSN Previous Signature: 03/01/2023 1:02:45 PM Version By: Duanne Guess MD FACS Entered By: Shawn Stall on 03/02/2023 14:58:36 -------------------------------------------------------------------------------- HxROS Details Patient Name: Date of Service: [name], Cory RIO N L. 03/01/2023 12:45 PM Medical Record Number: [id number] Patient Account Number: [id number] Date of Birth/Sex: Treating RN: 2005/06/28 (16 y.o. M) Primary Care Provider: Edwina Barth Other Clinician: Referring Provider: Treating Provider/Extender: Eilene Ghazi in Treatment: 37 Information Obtained From Caregiver Chart Eyes Medical History: Negative for: Cataracts; Glaucoma; Optic Neuritis Ear/Nose/Mouth/Throat Medical History: Negative for: Chronic sinus problems/congestion; Middle ear problems Past Medical History Notes: allergic rhinitis Respiratory Medical History: Positive for: Asthma - mild Gastrointestinal Medical History: Past Medical History Notes: ostomy, bowel program Endocrine Medical HistoryFARES, RAMTHUN ([id number]) [id number] Page 9 of 10 Negative for: Type I Diabetes; Type II Diabetes Genitourinary Medical History: Negative for: End Stage Renal Disease Past Medical History Notes: neurogenic bladder Integumentary (Skin) Medical History: Negative for: History of Burn Past Medical History Notes: eczema Musculoskeletal Medical History: Past Medical History Notes: contracture of left hip, spinabifida Neurologic Medical History: 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, loloprosencephaly, hydrocephalus Oncologic Medical History: Negative for: Received Chemotherapy; Received Radiation Psychiatric Medical History: Negative for: Anorexia/bulimia; Confinement Anxiety Immunizations Pneumococcal Vaccine: Received Pneumococcal Vaccination:  No Implantable Devices No devices added Hospitalization / Surgery History Type of Hospitalization/Surgery myringotomy hip surgery ventriculoperitoneal shunt Family and Social History Cancer: No; Diabetes: No; Heart Disease: No; Hereditary Spherocytosis: No; Hypertension: No; Kidney Disease: Yes -  Father; Lung Disease: No; Seizures: No; Stroke: No; Thyroid Problems: No; Tuberculosis: No; Never smoker; Marital Status - Single; Alcohol Use: Never; Drug Use: No History; Caffeine Use: Moderate; Financial Concerns: No; Food, Clothing or Shelter Needs: No; Support System Lacking: No; Transportation Concerns: No Electronic Signature(s) Signed: 03/01/2023 2:19:09 PM By: Duanne Guess MD FACS Entered By: Duanne Guess on 03/01/2023 13:00:10 -------------------------------------------------------------------------------- SuperBill Details Patient Name: Date of Service: [name], Cory RIO N L. 03/01/2023 Medical Record Number: [id number] Patient Account Number: [id number] Date of Birth/Sex: Treating RN: [birth year] (16 y.o. M) Primary Care Provider: Edwina Barth Other Clinician: Referring Provider: Treating Provider/Extender: [name] [place], [place] ([id number]) [id number] Page 10 of 10 Weeks in Treatment: 37 Diagnosis Coding ICD-10 Codes Code Description L89.314 Pressure ulcer of right buttock, stage 4 M86.152 Other acute osteomyelitis, left femur Q05.2 Lumbar spina bifida with hydrocephalus K59.2 Neurogenic bowel, not elsewhere classified N31.9 Neuromuscular dysfunction of bladder, unspecified J45.20 Mild intermittent asthma, uncomplicated Facility Procedures : CPT4 Code: 84696295 Description: 97605 - WOUND VAC-50 SQ CM OR LESS Modifier: Quantity: 1 Physician Procedures : CPT4 Code Description Modifier [id number] - WC PHYS LEVEL 4 - EST PT ICD-10 Diagnosis Description L89.314 Pressure ulcer of right buttock, stage 4  M86.152 Other acute osteomyelitis, left femur Q05.2 Lumbar spina bifida with hydrocephalus K59.2  Neurogenic bowel, not elsewhere classified Quantity: 1 Electronic Signature(s) Signed: 03/01/2023 1:03:48 PM By: Duanne Guess MD FACS Entered By: Duanne Guess on 03/01/2023 13:03:47

## 2023-03-01 NOTE — Progress Notes (Signed)
ANUAR, WALGREN ([id number]) [phone].pdf Page 1 of 7 Visit Report for 03/01/2023 Arrival Information Details Patient Name: Date of Service: [name] 03/01/2023 12:45 PM Medical Record Number: [id number] Patient Account Number: [id number] Date of Birth/Sex: Treating RN: [birth year] (17 y.o. [name] Primary Care Adalberto Metzgar: Edwina Barth Other Clinician: Referring Kristie Bracewell: Treating Sanda Dejoy/Extender: Eilene Ghazi in Treatment: 37 Visit Information History Since Last Visit Added or deleted any medications: No Patient Arrived: Wheel Chair Any new allergies or adverse reactions: No Arrival Time: 12:35 Had Cory fall or experienced change in No Accompanied By: grandmother activities of daily living that may affect Transfer Assistance: None risk of falls: Patient Identification Verified: Yes Signs or symptoms of abuse/neglect since last visito No Secondary Verification Process Completed: Yes Hospitalized since last visit: No Patient Requires Transmission-Based Precautions: No Implantable device outside of the clinic excluding No Patient Has Alerts: No cellular tissue based products placed in the center since last visit: Has Dressing in Place as Prescribed: Yes Pain Present Now: No Electronic Signature(s) Signed: 03/01/2023 4:01:24 PM By: Zenaida Deed RN, BSN Entered By: Zenaida Deed on 03/01/2023 12:40:52 -------------------------------------------------------------------------------- Encounter Discharge Information Details Patient Name: Date of Service: [name], [name] 03/01/2023 12:45 PM Medical Record Number: [id number] Patient Account Number: [id number] Date of Birth/Sex: Treating RN: Oct 28, 2005 (17 y.o. [name] Primary Care Roger Kettles: Edwina Barth Other Clinician: Referring Nayeli Calvert: Treating Roch Quach/Extender: Eilene Ghazi in Treatment:  37 Encounter Discharge Information Items Discharge Condition: Stable Ambulatory Status: Wheelchair Discharge Destination: Home Transportation: Private Auto Accompanied By: grandmother Schedule Follow-up Appointment: Yes Clinical Summary of Care: Patient Declined Electronic Signature(s) Signed: 03/01/2023 4:01:24 PM By: Zenaida Deed RN, BSN Entered By: Zenaida Deed on 03/01/2023 13:19:44 [name] ([id number]) [id number] Page 2 of 7 -------------------------------------------------------------------------------- Lower Extremity Assessment Details Patient Name: Date of Service: [name] 03/01/2023 12:45 PM Medical Record Number: [id number] Patient Account Number: [id number] Date of Birth/Sex: Treating RN: [birth year] (17 y.o. [name] Primary Care Rozell Theiler: Edwina Barth Other Clinician: Referring Yaphet Smethurst: Treating Kajsa Butrum/Extender: [name] Weeks in Treatment: 37 Electronic Signature(s) Signed: 03/01/2023 4:01:24 PM By: Zenaida Deed RN, BSN Entered By: Zenaida Deed on 03/01/2023 12:41:33 -------------------------------------------------------------------------------- Multi Wound Chart Details Patient Name: Date of Service: [name], [name] 03/01/2023 12:45 PM Medical Record Number: [id number] Patient Account Number: [id number] Date of Birth/Sex: Treating RN: 2005/06/16 (17 y.o. M) Primary Care Curly Mackowski: Edwina Barth Other Clinician: Referring Dezmin Kittelson: Treating Darlena Koval/Extender: [name] Weeks in Treatment: 37 Vital Signs Height(in): 60 Pulse(bpm): 60 Weight(lbs): 110 Blood Pressure(mmHg): 104/66 Body Mass Index(BMI): 21.5 Temperature(F): 97.9 Respiratory Rate(breaths/min): 18 [1:Photos:] [N/Cory:N/Cory] Right Ischium N/Cory N/Cory Wound Location: Pressure Injury N/Cory N/Cory Wounding Event: Pressure Ulcer N/Cory N/Cory Primary Etiology: Asthma,  Paraplegia N/Cory N/Cory Comorbid History: 05/11/2022 N/Cory N/Cory Date Acquired: 70 N/Cory N/Cory Weeks of Treatment: Open N/Cory N/Cory Wound Status: No N/Cory N/Cory Wound Recurrence: 3.5x3.8x1.5 N/Cory N/Cory Measurements L x W x D (cm) 10.446 N/Cory N/Cory Cory (cm) : rea 15.669 N/Cory N/Cory Volume (cm) : -62.40% N/Cory N/Cory % Reduction in Cory rea: -387.20% N/Cory N/Cory % Reduction in Volume: 12 Starting Position 1 (o'clock): 4 Ending Position 1 (o'clock): 2.5 Maximum Distance 1 (cm): Yes N/Cory N/Cory Undermining: Category/Stage IV N/Cory N/Cory Classification: Medium N/Cory N/Cory Exudate Cory mount: Serosanguineous N/Cory N/Cory Exudate Type: red, brown N/Cory N/Cory Exudate Color: Well defined, not attached N/Cory N/Cory Wound  MarginDERL, ABALOS ([id number]) [id number] Page 3 of 7 Large (67-100%) N/Cory N/Cory Granulation Amount: Red, Pink N/Cory N/Cory Granulation Quality: None Present (0%) N/Cory N/Cory Necrotic Amount: Fat Layer (Subcutaneous Tissue): Yes N/Cory N/Cory Exposed Structures: Fascia: No Tendon: No Muscle: No Joint: No Bone: No None N/Cory N/Cory Epithelialization: No Abnormalities Noted N/Cory N/Cory Periwound Skin Texture: No Abnormalities Noted N/Cory N/Cory Periwound Skin Moisture: No Abnormalities Noted N/Cory N/Cory Periwound Skin Color: No Abnormality N/Cory N/Cory Temperature: Negative Pressure Wound Therapy N/Cory N/Cory Procedures Performed: Maintenance (NPWT) Treatment Notes Electronic Signature(s) Signed: 03/01/2023 12:59:01 PM By: Duanne Guess MD FACS Entered By: Duanne Guess on 03/01/2023 12:59:00 -------------------------------------------------------------------------------- Multi-Disciplinary Care Plan Details Patient Name: Date of Service: [name], [name] 03/01/2023 12:45 PM Medical Record Number: [id number] Patient Account Number: [id number] Date of Birth/Sex: Treating RN: Sep 16, 2005 (17 y.o. [name] Primary Care Barrett Goldie: Edwina Barth Other Clinician: Referring Srija Southard: Treating  Kenita Bines/Extender: Eilene Ghazi in Treatment: 37 Multidisciplinary Care Plan reviewed with physician Active Inactive Pressure Nursing Diagnoses: Knowledge deficit related to causes and risk factors for pressure ulcer development Knowledge deficit related to management of pressures ulcers Potential for impaired tissue integrity related to pressure, friction, moisture, and shear Goals: Patient/caregiver will verbalize understanding of pressure ulcer management Date Initiated: 06/14/2022 Target Resolution Date: 03/29/2023 Goal Status: Active Interventions: Assess: immobility, friction, shearing, incontinence upon admission and as needed Assess offloading mechanisms upon admission and as needed Assess potential for pressure ulcer upon admission and as needed Treatment Activities: Patient referred for seating evaluation to ensure proper offloading : 06/14/2022 Pressure reduction/relief device ordered : 06/14/2022 Notes: Wound/Skin Impairment Nursing Diagnoses: Impaired tissue integrity Knowledge deficit related to ulceration/compromised skin integrity Goals: Patient/caregiver will verbalize understanding of skin care regimen DODGE, ATOR ([id number]) [phone].pdf Page 4 of 7 Date Initiated: 06/14/2022 Target Resolution Date: 03/29/2023 Goal Status: Active Ulcer/skin breakdown will have Cory volume reduction of 30% by week 4 Date Initiated: 06/14/2022 Date Inactivated: 07/15/2022 Target Resolution Date: 09/14/2022 Unmet Reason: requires new w/c Goal Status: Unmet cushion Ulcer/skin breakdown will have Cory volume reduction of 50% by week 8 Date Initiated: 07/15/2022 Date Inactivated: 09/16/2022 Target Resolution Date: 08/12/2022 Goal Status: Unmet Unmet Reason: infection Interventions: Assess patient/caregiver ability to obtain necessary supplies Assess patient/caregiver ability to perform ulcer/skin care regimen upon admission and as  needed Assess ulceration(s) every visit Provide education on ulcer and skin care Treatment Activities: Skin care regimen initiated : 06/14/2022 Topical wound management initiated : 06/14/2022 Notes: Electronic Signature(s) Signed: 03/01/2023 4:01:24 PM By: Zenaida Deed RN, BSN Entered By: Zenaida Deed on 03/01/2023 12:52:36 -------------------------------------------------------------------------------- Negative Pressure Wound Therapy Maintenance (NPWT) Details Patient Name: Date of Service: Sheryn Bison 03/01/2023 12:45 PM Medical Record Number: [id number] Patient Account Number: [id number] Date of Birth/Sex: Treating RN: [birth year] (16 y.o. [name] Primary Care Parisa Pinela: Edwina Barth Other Clinician: Referring Cambria Osten: Treating Iness Pangilinan/Extender: [name] Weeks in Treatment: 37 NPWT Maintenance Performed for: Wound #1 Right Ischium Performed By: Zenaida Deed, RN Type: Medela [employer] Coverage Size (sq cm): 13.3 Pressure Type: Constant Pressure Setting: 125 mmHG Drain Type: None Primary Contact: Other : silver collagen Sponge/Dressing Type: Foam- Black Date Initiated: 08/26/2022 Dressing Removed: Yes Quantity of Sponges/Gauze Removed: 1 Canister Changed: No Dressing Reapplied: Yes Quantity of Sponges/Gauze Inserted: 1 Respones T Treatment: o good Days On NPWT : 188 Post Procedure Diagnosis Same as Pre-procedure Electronic Signature(s) Signed: 03/01/2023 4:01:24 PM By: Zenaida Deed RN, BSN Entered  By: Zenaida Deed on 03/01/2023 12:58:50 [name] ([id number]) [id number] Page 5 of 7 -------------------------------------------------------------------------------- Pain Assessment Details Patient Name: Date of Service: [name] 03/01/2023 12:45 PM Medical Record Number: [id number] Patient Account Number: [id number] Date of Birth/Sex: Treating RN: [birth year] (16  y.o. [name] Primary Care Samanthan Dugo: Edwina Barth Other Clinician: Referring Allysa Governale: Treating Starling Christofferson/Extender: [name] Weeks in Treatment: 37 Active Problems Location of Pain Severity and Description of Pain Patient Has Paino No Site Locations Rate the pain. Current Pain Level: 0 Pain Management and Medication Current Pain Management: Electronic Signature(s) Signed: 03/01/2023 4:01:24 PM By: Zenaida Deed RN, BSN Entered By: Zenaida Deed on 03/01/2023 12:41:26 -------------------------------------------------------------------------------- Patient/Caregiver Education Details Patient Name: Date of Service: Perry Mount RIO [name] 10/15/2024andnbsp12:45 PM Medical Record Number: [id number] Patient Account Number: [id number] Date of Birth/Gender: Treating RN: [birth year] (16 y.o. [name] Primary Care Physician: Edwina Barth Other Clinician: Referring Physician: Treating Physician/Extender: Eilene Ghazi in Treatment: 37 Education Assessment Education Provided To: Patient Education Topics Provided Pressure: Methods: Explain/Verbal Responses: Reinforcements needed, State content correctly Wound/Skin Impairment: Methods: Explain/Verbal Responses: Reinforcements needed, State content correctly KEILON, RESSEL ([id number]) [phone].pdf Page 6 of 7 Electronic Signature(s) Signed: 03/01/2023 4:01:24 PM By: Zenaida Deed RN, BSN Entered By: Zenaida Deed on 03/01/2023 12:52:56 -------------------------------------------------------------------------------- Wound Assessment Details Patient Name: Date of Service: [name] 03/01/2023 12:45 PM Medical Record Number: [id number] Patient Account Number: [id number] Date of Birth/Sex: Treating RN: 2005-06-17 (16 y.o. [name] Primary Care Quin Mathenia: Edwina Barth Other Clinician: Referring  Terrell Ostrand: Treating Oniya Mandarino/Extender: [name] Weeks in Treatment: 37 Wound Status Wound Number: 1 Primary Etiology: Pressure Ulcer Wound Location: Right Ischium Wound Status: Open Wounding Event: Pressure Injury Comorbid History: Asthma, Paraplegia Date Acquired: 05/11/2022 Weeks Of Treatment: 37 Clustered Wound: No Photos Wound Measurements Length: (cm) 3.5 Width: (cm) 3.8 Depth: (cm) 1.5 Area: (cm) 10.446 Volume: (cm) 15.669 % Reduction in Area: -62.4% % Reduction in Volume: -387.2% Epithelialization: None Tunneling: No Undermining: Yes Starting Position (o'clock): 12 Ending Position (o'clock): 4 Maximum Distance: (cm) 2.5 Wound Description Classification: Category/Stage IV Wound Margin: Well defined, not attached Exudate Amount: Medium Exudate Type: Serosanguineous Exudate Color: red, brown Foul Odor After Cleansing: No Slough/Fibrino No Wound Bed Granulation Amount: Large (67-100%) Exposed Structure Granulation Quality: Red, Pink Fascia Exposed: No Necrotic Amount: None Present (0%) Fat Layer (Subcutaneous Tissue) Exposed: Yes Tendon Exposed: No Muscle Exposed: No Joint Exposed: No Bone Exposed: No Periwound Skin Texture Texture Color No Abnormalities Noted: Yes No Abnormalities Noted: Yes Moisture Temperature / Pain Vantil, Braedin L ([id number]) [id number] Page 7 of 7 No Abnormalities Noted: Yes Temperature: No Abnormality Treatment Notes Wound #1 (Ischium) Wound Laterality: Right Cleanser Soap and Water Discharge Instruction: May shower and wash wound with dial antibacterial soap and water prior to dressing change. Vashe 5.8 (oz) Discharge Instruction: Cleanse the wound with Vashe prior to applying Cory clean dressing using gauze sponges, not tissue or cotton balls. Peri-Wound Care Skin Prep Discharge Instruction: Use skin prep as directed Topical Primary Dressing Promogran Prisma Matrix, 4.34  (sq in) (silver collagen) Discharge Instruction: Moisten collagen with saline or hydrogel NPWT Secondary Dressing Secured With Compression Wrap Compression Stockings Add-Ons Electronic Signature(s) Signed: 03/01/2023 4:01:24 PM By: Zenaida Deed RN, BSN Entered By: Zenaida Deed on 03/01/2023 12:51:25 -------------------------------------------------------------------------------- Vitals Details Patient Name: Date of Service: [name], [name] 03/01/2023 12:45 PM Medical  Record Number: [id number] Patient Account Number: [id number] Date of Birth/Sex: Treating RN: [birth year] (16 y.o. [name] Primary Care Elinora Weigand: Edwina Barth Other Clinician: Referring Renae Mottley: Treating Jabori Henegar/Extender: [name] Weeks in Treatment: 37 Vital Signs Time Taken: 12:40 Temperature (F): 97.9 Height (in): 60 Pulse (bpm): 60 Weight (lbs): 110 Respiratory Rate (breaths/min): 18 Body Mass Index (BMI): 21.5 Blood Pressure (mmHg): 104/66 Reference Range: 80 - 120 mg / dl Electronic Signature(s) Signed: 03/01/2023 4:01:24 PM By: Zenaida Deed RN, BSN Entered By: Zenaida Deed on 03/01/2023 12:41:20

## 2023-03-07 ENCOUNTER — Encounter: Payer: Self-pay | Admitting: Emergency Medicine

## 2023-03-07 ENCOUNTER — Ambulatory Visit: Payer: Medicaid Other | Admitting: Emergency Medicine

## 2023-03-07 VITALS — BP 118/76 | HR 87 | Temp 98.3°F | Ht <= 58 in

## 2023-03-07 DIAGNOSIS — B360 Pityriasis versicolor: Secondary | ICD-10-CM | POA: Diagnosis not present

## 2023-03-07 MED ORDER — SELENIUM SULFIDE 2.5 % EX LOTN: 1.0000 | TOPICAL_LOTION | Freq: Every day | CUTANEOUS | 12 refills | Status: AC

## 2023-03-07 NOTE — Patient Instructions (Signed)
 Tinea Versicolor  Tinea versicolor is a skin infection. It is caused by a type of yeast. It is normal for some yeast to be on your skin, but too much yeast causes this infection. The infection causes a rash of light or dark patches on your skin. The rash is most common on the chest, back, neck, or upper arms. The infection usually does not cause other problems. If it is treated, it will probably go away in a few weeks. The infection cannot be spread from one person to another (is notcontagious). What are the causes? This condition is caused by a certain type of yeast that starts to grow too much on your skin. What increases the risk? Heat and humidity. Sweating too much. Hormone changes. This may happen when taking birth control pills. Oily skin. A weak disease-fighting system (immunesystem). What are the signs or symptoms? A rash of light or dark patches on your skin. The rash may have: Patches of tan or pink spots (on light skin). Patches of white or brown spots (on dark skin). Patches of skin that do not tan. Well-marked edges. Scales. Mild itching. There may also be no itching. How is this treated? Treatment for this condition may include: Dandruff shampoo. The shampoo may be used on the affected skin during showers or baths. Over-the-counter medicated skin cream, lotion, or soaps. Prescription antifungal medicine. This may include cream or pills. Medicine to help your itching. Follow these instructions at home: Use over-the-counter and prescription medicines only as told by your doctor. Wash your skin with dandruff shampoo as told by your doctor. Do not scratch your skin in the rash area. Avoid places that are hot and humid. Do not use tanning booths. Try to avoid sweating a lot. Contact a doctor if: Your symptoms get worse. You have a fever. You have signs of infection such as: Redness, swelling, or pain in the rash area. Warmth coming from your rash. Fluid or blood  coming from your rash. Pus or a bad smell coming from your rash. Your rash comes back (recurs) after treatment. Your rash does not improve with treatment. Your rash spreads to other parts of the body. Summary Tinea versicolor is a skin infection. It causes a rash of light or dark patches on your skin. The rash is most common on the chest, back, neck, or upper arms. This infection usually does not cause other problems. Use over-the-counter and prescription medicines only as told by your doctor. If the infection is treated, it will probably go away in a few weeks. This information is not intended to replace advice given to you by your health care provider. Make sure you discuss any questions you have with your health care provider. Document Revised: 07/22/2020 Document Reviewed: 07/22/2020 Elsevier Patient Education  2024 [employer].

## 2023-03-07 NOTE — Progress Notes (Signed)
[name] 17 y.o.   Chief Complaint  Patient presents with   Rash    Skin rash/irritation for the past month. Present across the shoulder and right arm, worse on back. No pain present    HISTORY OF PRESENT ILLNESS: This is a 17 y.o. male complaining of itchy rash to his back for the past month. No other complaints or medical concerns today.  Rash Pertinent negatives include no fever or vomiting.     Prior to Admission medications   Medication Sig Start Date End Date Taking? Authorizing Provider  albuterol (PROVENTIL HFA;VENTOLIN HFA) 108 (90 Base) MCG/ACT inhaler Inhale 2 puffs into the lungs every 6 (six) hours as needed for shortness of breath. 07/27/07  Yes [provider]  albuterol (PROVENTIL) (2.5 MG/3ML) 0.083% nebulizer solution Take 2.5 mg by nebulization every 6 (six) hours as needed for wheezing or shortness of breath.   Yes [provider]  budesonide (PULMICORT) 0.5 MG/2ML nebulizer solution Take 0.5 mg by nebulization 3 (three) times daily as needed (shortness of breath).   Yes [provider]  budesonide-formoterol (SYMBICORT) 80-4.5 MCG/ACT inhaler Inhale 1 puff into the lungs daily as needed (shortness of breath/wheezing).   Yes [provider]  cetirizine (ZYRTEC) 10 MG tablet Take 10 mg by mouth daily as needed for allergies.  03/03/20  Yes [provider]  cholecalciferol (VITAMIN D3) 25 MCG (1000 UNIT) tablet Take 1 tablet (1,000 Units total) by mouth daily. 01/25/23 01/20/24 Yes Braulio Kiedrowski, Eilleen Kempf, MD  clindamycin-benzoyl peroxide [facility]) gel Apply 1 application  topically daily. For acne 09/06/17  Yes [provider]  polyethylene glycol (MIRALAX / GLYCOLAX) 17 g packet Take 17 g by mouth every other day. 01/25/23  Yes Landers Prajapati, Eilleen Kempf, MD  selenium sulfide (SELSUN) 2.5 % lotion Apply 1 Application topically daily for 7 days. 03/07/23 03/14/23 Yes Jery Hollern, Eilleen Kempf, MD  triamcinolone ointment  (KENALOG) 0.5 % Apply 1 application topically every other day. For eczema 03/25/20  Yes [provider]  metroNIDAZOLE (FLAGYL) 500 MG tablet Take 1 tablet (500 mg total) by mouth 3 (three) times daily. DO NOT CONSUME ALCOHOL WHILE TAKING THIS MEDICATION. 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   Pressure ulcer of other site, unstageable (HCC) 04/25/2020   History of pressure ulcer 04/23/2020   Pressure ulcer of toe of left foot 04/23/2020   Tinea pedis 04/23/2020   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   Vitamin D deficiency 02/25/2020   Dietary counseling 02/25/2020   Scoliosis deformity of spine 08/03/2018   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   Precocious puberty 09/06/2017   Urinary incontinence without sensory awareness 06/23/2013   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   Neurogenic bladder 10/29/2010   Neurogenic bowel 10/29/2010   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   Lumbar spina bifida with hydrocephalus (HCC) 03/01/2005    Past Medical History:  Diagnosis Date   Spina bifida    Spina bifida, unspecified hydrocephalus presence, unspecified spinal region (HCC)  Stomatitis    Urinary tract infection     Past Surgical History:  Procedure Laterality Date   FOOT SURGERY     HIP SURGERY     MYRINGOTOMY     SHUNT EXTERNALIZATION     spina bifida     stigmatism repair      Social History   Socioeconomic History   Marital status: Single     Spouse name: Not on file   Number of children: Not on file   Years of education: Not on file   Highest education level: Not on file  Occupational History   Not on file  Tobacco Use   Smoking status: Never   Smokeless tobacco: Never  Vaping Use   Vaping status: Never Used  Substance and Sexual Activity   Alcohol use: No   Drug use: Never   Sexual activity: Never  Other Topics Concern   Not on file  Social History Narrative   Lives with Parents, 2 siblings. No pets.   Social Determinants of Health   Financial Resource Strain: Not on file  Food Insecurity: Not on file  Transportation Needs: Not on file  Physical Activity: Not on file  Stress: Not on file  Social Connections: Not on file  Intimate Partner Violence: Not on file    Family History  Adopted: Yes  Problem Relation Age of Onset   Multiple sclerosis Maternal Grandmother      Review of Systems  Constitutional: Negative.  Negative for chills and fever.  Cardiovascular: Negative.  Negative for chest pain and palpitations.  Gastrointestinal:  Negative for abdominal pain, nausea and vomiting.  Skin:  Positive for rash.  Neurological:  Negative for dizziness and headaches.  All other systems reviewed and are negative.   Vitals:   03/07/23 1515  BP: 118/76  Pulse: 87  Temp: 98.3 F (36.8 C)  SpO2: 99%    Physical Exam Vitals reviewed.  Constitutional:      Appearance: Normal appearance.  HENT:     Head: Normocephalic.  Eyes:     Extraocular Movements: Extraocular movements intact.  Cardiovascular:     Rate and Rhythm: Normal rate.  Pulmonary:     Effort: Pulmonary effort is normal.  Skin:    General: Skin is warm and dry.     Findings: Rash present.  Neurological:     Mental Status: He is alert and oriented to person, place, and time.  Psychiatric:        Mood and Affect: Mood normal.        Behavior: Behavior normal.      ASSESSMENT & PLAN: A total of 32 minutes was spent with the  patient and counseling/coordination of care regarding preparing for this visit, review of most recent office visit notes, review of chronic medical conditions, review of all medications, differential diagnosis of rash and possible need for dermatology evaluation, management, prognosis, documentation, and need for follow-up.  Problem List Items Addressed This Visit       Musculoskeletal and Integument   Tinea versicolor - Primary    Chronic itchy rash Recommend selenium sulfide 2.5% lotion once a day for 7 days Dermatology evaluation if no better in a couple weeks      Relevant Medications   selenium sulfide (SELSUN) 2.5 % lotion   Patient Instructions  Tinea Versicolor  Tinea versicolor is a skin infection. It is caused by a type of yeast. It is normal for some yeast to be on your skin, but too  much yeast causes this infection. The infection causes a rash of light or dark patches on your skin. The rash is most common on the chest, back, neck, or upper arms. The infection usually does not cause other problems. If it is treated, it will probably go away in a few weeks. The infection cannot be spread from one person to another (is notcontagious). What are the causes? This condition is caused by a certain type of yeast that starts to grow too much on your skin. What increases the risk? Heat and humidity. Sweating too much. Hormone changes. This may happen when taking birth control pills. Oily skin. A weak disease-fighting system (immunesystem). What are the signs or symptoms? A rash of light or dark patches on your skin. The rash may have: Patches of tan or pink spots (on light skin). Patches of white or brown spots (on dark skin). Patches of skin that do not tan. Well-marked edges. Scales. Mild itching. There may also be no itching. How is this treated? Treatment for this condition may include: Dandruff shampoo. The shampoo may be used on the affected skin during showers or  baths. Over-the-counter medicated skin cream, lotion, or soaps. Prescription antifungal medicine. This may include cream or pills. Medicine to help your itching. Follow these instructions at home: Use over-the-counter and prescription medicines only as told by your doctor. Wash your skin with dandruff shampoo as told by your doctor. Do not scratch your skin in the rash area. Avoid places that are hot and humid. Do not use tanning booths. Try to avoid sweating a lot. Contact a doctor if: Your symptoms get worse. You have a fever. You have signs of infection such as: Redness, swelling, or pain in the rash area. Warmth coming from your rash. Fluid or blood coming from your rash. Pus or a bad smell coming from your rash. Your rash comes back (recurs) after treatment. Your rash does not improve with treatment. Your rash spreads to other parts of the body. Summary Tinea versicolor is a skin infection. It causes a rash of light or dark patches on your skin. The rash is most common on the chest, back, neck, or upper arms. This infection usually does not cause other problems. Use over-the-counter and prescription medicines only as told by your doctor. If the infection is treated, it will probably go away in a few weeks. This information is not intended to replace advice given to you by your health care provider. Make sure you discuss any questions you have with your health care provider. Document Revised: 07/22/2020 Document Reviewed: 07/22/2020 Elsevier Patient Education  2024 [employer]     Edwina Barth, MD [facility]

## 2023-03-07 NOTE — Assessment & Plan Note (Signed)
Chronic itchy rash Recommend selenium sulfide 2.5% lotion once a day for 7 days Dermatology evaluation if no better in a couple weeks

## 2023-03-10 ENCOUNTER — Ambulatory Visit: Payer: Medicaid Other | Admitting: Emergency Medicine

## 2023-03-16 ENCOUNTER — Encounter (HOSPITAL_BASED_OUTPATIENT_CLINIC_OR_DEPARTMENT_OTHER): Payer: Medicaid Other | Admitting: General Surgery

## 2023-03-16 DIAGNOSIS — L89314 Pressure ulcer of right buttock, stage 4: Secondary | ICD-10-CM | POA: Diagnosis not present

## 2023-03-16 NOTE — Progress Notes (Signed)
[name], [name] ([id number]) [id number] Page 1 of 10 Visit Report for 03/16/2023 Chief Complaint Document Details Patient Name: Date of Service: [name] 03/16/2023 8:15 A M Medical Record Number: [id number] Patient Account Number: [id number] Date of Birth/Sex: Treating RN: [birth year] (17 y.o. M) Primary Care Provider: Edwina Barth Other Clinician: Referring Provider: Treating Provider/Extender: Eilene Ghazi in Treatment: 39 Information Obtained from: Patient Chief Complaint Patient is at the clinic for treatment of an open pressure ulcer Electronic Signature(s) Signed: 03/16/2023 8:57:56 AM By: Duanne Guess MD FACS Entered By: Duanne Guess on 03/16/2023 05:57:56 -------------------------------------------------------------------------------- HPI Details Patient Name: Date of Service: [name], [name] 03/16/2023 8:15 A M Medical Record Number: [id number] Patient Account Number: [id number] Date of Birth/Sex: Treating RN: May 31, 2005 (17 y.o. 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 Around Christmas time, he developed a pressure ulcer on his right ischium. This seems to be related to the cushioning in his wheelchair. They have an appointment coming up on Wednesday to have this checked and addressed. For some reason he has been prescribed doxycycline and Flagyl and has a couple days left of this. They have just been covering the site with gauze. On his right ischial area, he has an oval wound that extends into the fat layer. There is some slough and fibrinous exudate present on the surface. There is  no erythema, induration, malodor, or purulent drainage to suggest infection. 06/21/2022: The wound measured larger and deeper today. There is evidence of pressure induced tissue injury and the surface is a bit dry with fibrinous exudate accumulation. He does not yet have a new cushion for his wheelchair. 06/30/2022: His wound is deeper again. The surface is very clean. They did obtain the eggcrate cushion, but did not cut out an area to offload his ulcer. 07/07/2022: His wound measured a little bit smaller today. There is slough on the wound surface. For some reason they did not get the Iodosorb gel and have been using Iodosorb pads; I do not think these are doing as good a job of chemical debridement as the gel would. They did cut out the space in his eggcrate foam cushion to accommodate his wound and I think this is helpful. 07/15/2022: His wound is deeper today. The tissue is pale, but the wound is fairly clean. For reasons that remain unclear, his wheelchair cushion has not been replaced and his caregivers have not contacted [employer] to do anything about it. 07/20/2022: His wound is a little bit smaller. There is still a lot of undermining. He has more slough accumulation today. We ordered him a Roho cushion last week, but he has not yet received it. 3/13; patient presents for follow-up. He has been using Hydrofera Blue to the right ischium wound bed. He has no issues or complaints today. 08/05/2022: The color of the tissue in the wound bed has improved markedly. It is much more beefy and red, whereas previously it has been quite pale. There is some slough on the wound surface. He finally got his custom molded wheelchair cushion for his school chair and a Roho cushion for home. There was an error on the part of the DME company for his low-air-loss mattress bed, but this is  in the process of being resolved. [name], [name] ([id number]) [id number] Page 2 of 10 08/26/2022: The  wound is measuring a little deeper; it looks like some fat simply separated in the center of his wound, rather than worsening of the pressure injury. The quality of the tissue continues to improve. His low-air-loss mattress was finally delivered. He has his wound VAC with him for application today. 09/02/2022: The wound continues to worsen. I can palpate his trochanter in the deepest part of the wound, although the bone is not exposed. There is a strong odor coming from the wound and the tissue surface is gray. 09/09/2022: The progression of the wound seems to have been arrested. The color is markedly improved and is now beefy red. There are still some areas of the tissue that are more purpleish, suggesting ongoing pressure induced injury. The odor has abated. The culture that I took produced a polymicrobial population including Proteus, Pseudomonas, and multiple other species. He is currently taking levofloxacin and metronidazole. 09/16/2022: The wound continues to improve. The color and is red and the surface is appropriately moist. He has been in his Roho cushion and I do not see any areas of further pressure induced injury. He is completing his course of oral levofloxacin and Flagyl. 09/23/2022: No pressure induced tissue damage this week. There is no odor coming from the wound. No significant changes to the wound dimensions, however. 09/29/2022: The wound size remains about the same. The tissues are looking a bit healthier. There is slough on the wound surface. 10/07/2022: The wound is looking better. The undermining at 12:00 has closed down. The tunneling that extends over the trochanter is still present, but is tighter and narrower. The tissues look healthier. There is an odor, however, on the dressing. 10/14/2022: There has been very nice improvement in the wound over the past week. The tunneling of the trochanter is shorter by a good half centimeter and the tissue is much tighter. Near 12:00 the wound  is nearly flush with the surrounding skin surface. The odor has resolved. 10/25/2022: He had his wound VAC off for graduation and there has been some breakdown over the trochanter. The bone is not yet exposed, but the tissue is thinner. There is a layer of slough on the wound surface. 11/03/2022: There has been nice improvement since our last visit. The tissue over the trochanter has filled again and the space is contracting. The granulation tissue appears healthy and there is no slough. No evidence of ongoing pressure-induced tissue injury. 11/09/2022: The wound continues to improve. The undermined area is filling in more, but the tissue over the trochanter is still quite thin. There is minimal biofilm on the surface and no evidence of pressure induced tissue injury today. 11/16/2022: The undermined portion of the wound has filled in even more and the tissue over the trochanter is starting to get a bit thicker. Everything is extremely clean today without any odor or slough accumulation. 11/22/2022: The undermined area of the wound continues to contract. The wound is very clean. The tissue is robust and beefy red. 12/14/2022: The undermined area has come in by almost a cm. The tissue over the trochanter feels thicker. The wound is clean without any odor. 01/03/2023: The wound is stable, but there is some purpleish discoloration directly over the ischium consistent with pressure in this area. He has a little bit of slough accumulation around the edges of the wound. There is also an odor to the wound, despite use of  topical gentamicin and mupirocin. 01/19/2023: The wound has filled in and the cavity is not as deep. The area overlying the trochanter is closing in. I do not appreciate any pressure-related tissue injury. There is some slough on the surface. The odor has abated. He is currently still taking the Augmentin I prescribed in response to his culture data. The x- ray that was done was concerning for possible  osteomyelitis and he has had an MRI, but this has not yet been read. His albumin is normal. The referral to plastic surgery is on hold while we complete this testing and I suspect he will require treatment for osteomyelitis before they will agree to see him. 02/03/2023: The wound continues to contract. No pressure-related tissue injury. No odor. He saw infectious disease yesterday for osteomyelitis and will receive Dalvance infusions on a weekly basis. He does have an appointment to see plastics at [facility] coming up on the 26th of this month. 02/16/2023: There is senescent skin that has built up around the wound edges and some biofilm on the surface. The area overlying the trochanter has gotten more closed in and tighter. The external wound dimensions are about the same. He has completed his Dalvance treatment. When he saw plastic surgery last week, they recommended pressure mapping and intervention with different offloading including a new seat cushion, a reclining wheelchair to help a lot of the ischium and to decrease the amount of time he spends in his wheelchair. He was advised to spend most of his time lying in bed. He was advised to offload the area completely for 3 months and allow the [facility] more time to work. 03/01/2023: The wound is extremely clean today and is getting shallower with less undermining. The area over the trochanter is quite a bit tighter. There is no evidence of pressure induced tissue injury. 03/16/2023: I can no longer get a finger over the top of his trochanter. The tissue remains pink and viable although it does tend towards the dry side, slightly. Electronic Signature(s) Signed: 03/16/2023 8:58:38 AM By: Duanne Guess MD FACS Entered By: Duanne Guess on 03/16/2023 05:58:38 -------------------------------------------------------------------------------- Physical Exam Details Patient Name: Date of Service: Cory Mount RIO N L. 03/16/2023 8:15 A M Medical Record Number:  [id number] Patient Account Number: [id number] Date of Birth/Sex: Treating RN: 02-27-2006 (17 y.o. M) Primary Care Provider: Edwina Barth Other Clinician: Referring Provider: Treating Provider/Extender: [name] Weeks in Treatment: 39 Constitutional . . . . no acute distress. KAMARR, ARTS ([id number]) [id number] Page 3 of 10 Respiratory Normal work of breathing on room air.. Notes 03/16/2023: I can no longer get a finger over the top of his trochanter. The tissue remains pink and viable although it does tend towards the dry side, slightly. Electronic Signature(s) Signed: 03/16/2023 8:59:05 AM By: Duanne Guess MD FACS Entered By: Duanne Guess on 03/16/2023 05:59:05 -------------------------------------------------------------------------------- Physician Orders Details Patient Name: Date of Service: [name], [name] 03/16/2023 8:15 A M Medical Record Number: [id number] Patient Account Number: [id number] Date of Birth/Sex: Treating RN: May 12, 2006 (16 y.o. 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 - Dr. Lady Gary RM 1 Return A Wed 11/13 @ 11:15 am Anesthetic Wound #1 Right Ischium (In clinic) Topical Lidocaine 4% applied to wound bed Bathing/ Shower/  Hygiene May shower and wash wound with soap and water. Negative Presssure Wound Therapy Medela Wound Vac continuously at 150mm/hg Black Foam Off-Loading Low air-loss mattress (Group 2) - ordered through [employer] Roho cushion for wheelchair - sent referral to [employer] Turn and reposition every 2 hours - use arms to lift up off the wheelchair at least hourly while up in wheelchair Additional Orders / Instructions Follow Nutritious Diet - 70- 100 gms of protein per day. add protein drink such as Premeir Protein 2 times per day. Use the Juven for added protein intake. Wound Treatment Wound #1 - Ischium Wound Laterality: Right Cleanser: Soap and Water 3 x Per Week/15 Days Discharge Instructions: May shower and wash wound with dial antibacterial soap and water prior to dressing change. Cleanser: Vashe 5.8 (oz) 3 x Per Week/15 Days Discharge Instructions: Cleanse the wound with Vashe prior to applying a clean dressing using gauze sponges, not tissue or cotton balls. Peri-Wound Care: Skin Prep 3 x Per Week/15 Days KAYE, ROSAL ([id number]) [id number] Page 4 of 10 Discharge Instructions: Use skin prep as directed Prim Dressing: Promogran Prisma Matrix, 4.34 (sq in) (silver collagen) 3 x Per Week/15 Days ary Discharge Instructions: Moisten collagen with saline or hydrogel Prim Dressing: NPWT ary 3 x Per Week/15 Days Electronic Signature(s) Signed: 03/16/2023 9:20:12 AM By: Duanne Guess MD FACS Entered By: Duanne Guess on 03/16/2023 05:59:17 Prescription 03/16/2023 -------------------------------------------------------------------------------- [name] Duanne Guess MD Patient Name: Provider: 2005/12/07 [id number] Date of Birth: NPI#: Judie Petit [id number] Sex: DEA #: [phone] 2012 Phone #: License #: UPN: Patient Address: [address] Eligha Bridegroom [facility] [place], [place] [address] [address] [place], [place] [address] [phone] Allergies latex Provider's Orders Roho cushion for wheelchair - sent referral to [employer] Hand Signature: Date(s): Electronic Signature(s) Signed: 03/16/2023 9:20:12 AM By: Duanne Guess MD FACS Entered By: Duanne Guess on 03/16/2023 05:59:18 -------------------------------------------------------------------------------- Problem List Details Patient Name: Date of Service: [name], [name] 03/16/2023 8:15 A M Medical Record Number: [id number] Patient Account Number: [id number] Date of Birth/Sex: Treating RN: 2006-05-01 (16 y.o. 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 03/16/2023 8:15 A M Medical Record Number: [id number] Patient Account Number: [id number] Date of Birth/Sex: Treating RN: 01/31/2006 (17 y.o. 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 Around Christmas time, he developed a pressure ulcer on his right ischium. This seems to be related to the cushioning in his wheelchair. They have an appointment coming up on Wednesday to have this checked and addressed. For some reason he has been prescribed doxycycline and Flagyl and has a couple days left of this. They have just been covering the site with gauze. On his right ischial area, he has an oval wound that extends into the fat layer. There is some slough and fibrinous exudate present on the surface. There is no erythema, induration, malodor, or purulent drainage to suggest infection. 06/21/2022: The wound measured larger and deeper today. There is evidence of pressure induced tissue injury and the surface is a bit dry with fibrinous exudate accumulation. He does not yet have a new cushion for his wheelchair. 06/30/2022: His wound is deeper again. The surface is very clean. They did obtain the eggcrate cushion, but did not cut out an area to offload his ulcer. 07/07/2022: His wound measured a little bit smaller today. There is slough on the wound surface. For some reason they did not get the Iodosorb gel and have been using Iodosorb pads; I do not think these are doing as good a job of chemical debridement as the gel would. They did cut out the space in his eggcrate foam cushion to accommodate his wound and I think this is helpful. 07/15/2022: His wound is deeper today. The tissue is pale, but the wound is fairly clean. For reasons that remain unclear, his wheelchair cushion has not been replaced and his caregivers have not  contacted [employer] to do anything about it. 07/20/2022: His wound is a little bit smaller. There is still a lot of undermining. He has more slough accumulation today. We ordered him a Roho cushion last week, but he has not yet received it. ARTAVIOUS, BROSHEARS ([id number]) [id number] Page 6 of 10 3/13; patient presents for follow-up. He has been using Hydrofera Blue to the right ischium wound bed. He has no issues or complaints today. 08/05/2022: The color of the tissue in the wound bed has improved markedly. It is much more beefy and red, whereas previously it has been quite pale. There is some slough on the wound surface. He finally got his custom molded wheelchair cushion for his school chair and a Roho cushion for home. There was an error on the part of the DME company for his low-air-loss mattress bed, but this is in the process of being resolved. 08/26/2022: The wound is measuring a little deeper; it looks like some fat simply separated in the center of his wound, rather than worsening of the pressure injury. The quality of the tissue continues to improve. His low-air-loss mattress was finally delivered. He has his wound VAC with him for application today. 09/02/2022: The wound continues to worsen. I can palpate his trochanter in the deepest part of the wound, although the bone is not exposed. There is a strong odor coming from the wound and the tissue surface is gray. 09/09/2022: The progression of the wound seems to have been arrested. The color is markedly improved and is now beefy red. There are still some areas of the tissue that are more purpleish, suggesting ongoing pressure induced injury. The odor has abated. The culture that I took  produced a polymicrobial population including Proteus, Pseudomonas, and multiple other species. He is currently taking levofloxacin and metronidazole. 09/16/2022: The wound continues to improve. The color and is red and the surface is  appropriately moist. He has been in his Roho cushion and I do not see any areas of further pressure induced injury. He is completing his course of oral levofloxacin and Flagyl. 09/23/2022: No pressure induced tissue damage this week. There is no odor coming from the wound. No significant changes to the wound dimensions, however. 09/29/2022: The wound size remains about the same. The tissues are looking a bit healthier. There is slough on the wound surface. 10/07/2022: The wound is looking better. The undermining at 12:00 has closed down. The tunneling that extends over the trochanter is still present, but is tighter and narrower. The tissues look healthier. There is an odor, however, on the dressing. 10/14/2022: There has been very nice improvement in the wound over the past week. The tunneling of the trochanter is shorter by a good half centimeter and the tissue is much tighter. Near 12:00 the wound is nearly flush with the surrounding skin surface. The odor has resolved. 10/25/2022: He had his wound VAC off for graduation and there has been some breakdown over the trochanter. The bone is not yet exposed, but the tissue is thinner. There is a layer of slough on the wound surface. 11/03/2022: There has been nice improvement since our last visit. The tissue over the trochanter has filled again and the space is contracting. The granulation tissue appears healthy and there is no slough. No evidence of ongoing pressure-induced tissue injury. 11/09/2022: The wound continues to improve. The undermined area is filling in more, but the tissue over the trochanter is still quite thin. There is minimal biofilm on the surface and no evidence of pressure induced tissue injury today. 11/16/2022: The undermined portion of the wound has filled in even more and the tissue over the trochanter is starting to get a bit thicker. Everything is extremely clean today without any odor or slough accumulation. 11/22/2022: The undermined  area of the wound continues to contract. The wound is very clean. The tissue is robust and beefy red. 12/14/2022: The undermined area has come in by almost a cm. The tissue over the trochanter feels thicker. The wound is clean without any odor. 01/03/2023: The wound is stable, but there is some purpleish discoloration directly over the ischium consistent with pressure in this area. He has a little bit of slough accumulation around the edges of the wound. There is also an odor to the wound, despite use of topical gentamicin and mupirocin. 01/19/2023: The wound has filled in and the cavity is not as deep. The area overlying the trochanter is closing in. I do not appreciate any pressure-related tissue injury. There is some slough on the surface. The odor has abated. He is currently still taking the Augmentin I prescribed in response to his culture data. The x- ray that was done was concerning for possible osteomyelitis and he has had an MRI, but this has not yet been read. His albumin is normal. The referral to plastic surgery is on hold while we complete this testing and I suspect he will require treatment for osteomyelitis before they will agree to see him. 02/03/2023: The wound continues to contract. No pressure-related tissue injury. No odor. He saw infectious disease yesterday for osteomyelitis and will receive Dalvance infusions on a weekly basis. He does have an appointment to see plastics at  [facility] coming up on the 26th of this month. 02/16/2023: There is senescent skin that has built up around the wound edges and some biofilm on the surface. The area overlying the trochanter has gotten more closed in and tighter. The external wound dimensions are about the same. He has completed his Dalvance treatment. When he saw plastic surgery last week, they recommended pressure mapping and intervention with different offloading including a new seat cushion, a reclining wheelchair to help a lot of the ischium and to  decrease the amount of time he spends in his wheelchair. He was advised to spend most of his time lying in bed. He was advised to offload the area completely for 3 months and allow the [facility] more time to work. 03/01/2023: The wound is extremely clean today and is getting shallower with less undermining. The area over the trochanter is quite a bit tighter. There is no evidence of pressure induced tissue injury. 03/16/2023: I can no longer get a finger over the top of his trochanter. The tissue remains pink and viable although it does tend towards the dry side, slightly. Patient History Information obtained from Caregiver, Chart. Family History Kidney Disease - Father, No family history of Cancer, Diabetes, Heart Disease, Hereditary Spherocytosis, Hypertension, Lung Disease, Seizures, Stroke, Thyroid Problems, Tuberculosis. Social History Never smoker, Marital Status - Single, Alcohol Use - Never, Drug Use - No History, Caffeine Use - Moderate. Medical History Eyes Denies history of Cataracts, Glaucoma, Optic Neuritis Ear/Nose/Mouth/Throat Denies history of Chronic sinus problems/congestion, Middle ear problems Respiratory Patient has history of Asthma - mild Endocrine Denies history of Type I Diabetes, Type II Diabetes Genitourinary Denies history of End Stage Renal Disease Integumentary (Skin) BAYDEN, JEANNOT ([id number]) [id number] Page 7 of 10 Denies history of History of Burn Neurologic Patient has history of Paraplegia Oncologic Denies history of Received Chemotherapy, Received Radiation Psychiatric Denies history of Anorexia/bulimia, Confinement Anxiety Hospitalization/Surgery History - myringotomy. - hip surgery. - ventriculoperitoneal shunt. Medical A Surgical History Notes nd Ear/Nose/Mouth/Throat allergic rhinitis Gastrointestinal ostomy, bowel program Genitourinary neurogenic bladder Integumentary  (Skin) eczema Musculoskeletal contracture of left hip, spinabifida Neurologic developmental delay, loloprosencephaly, hydrocephalus Objective Constitutional no acute distress. Vitals Time Taken: 8:30 AM, Height: 60 in, Weight: 110 lbs, BMI: 21.5, Temperature: 97.6 F, Pulse: 65 bpm, Respiratory Rate: 20 breaths/min, Blood Pressure: 105/61 mmHg. Respiratory Normal work of breathing on room air.. General Notes: 03/16/2023: I can no longer get a finger over the top of his trochanter. The tissue remains pink and viable although it does tend towards the dry side, slightly. Integumentary (Hair, Skin) Wound #1 status is Open. Original cause of wound was Pressure Injury. The date acquired was: 05/11/2022. The wound has been in treatment 39 weeks. The wound is located on the Right Ischium. The wound measures 3cm length x 3.2cm width x 1cm depth; 7.54cm^2 area and 7.54cm^3 volume. There is Fat Layer (Subcutaneous Tissue) exposed. There is no tunneling noted, however, there is undermining starting at 9:00 and ending at 4:00 with a maximum distance of 2.5cm. There is a medium amount of serosanguineous drainage noted. The wound margin is well defined and not attached to the wound base. There is large (67- 100%) red, pink, pale granulation within the wound bed. There is a small (1-33%) amount of necrotic tissue within the wound bed including Adherent Slough. The periwound skin appearance had no abnormalities noted for texture. The periwound skin appearance had no abnormalities noted for moisture. The periwound skin appearance had no abnormalities noted for  color. Periwound temperature was noted as No Abnormality. Assessment Active Problems ICD-10 Pressure ulcer of right buttock, stage 4 Other acute osteomyelitis, left femur Lumbar spina bifida with hydrocephalus Neurogenic bowel, not elsewhere classified Neuromuscular dysfunction of bladder, unspecified Mild intermittent asthma,  uncomplicated Plan Follow-up Appointments: Return Appointment in 2 weeks. - Dr. Lady Gary RM 1 Wed 11/13 @ 11:15 am Anesthetic: Wound #1 Right Ischium: (In clinic) Topical Lidocaine 4% applied to wound bed Bathing/ Shower/ Hygiene: May shower and wash wound with soap and water. JARAN, MENCH ([id number]) [id number] Page 8 of 10 Negative Presssure Wound Therapy: Medela Wound Vac continuously at 135mm/hg Black Foam Off-Loading: Low air-loss mattress (Group 2) - ordered through [employer] Roho cushion for wheelchair - sent referral to [employer] Turn and reposition every 2 hours - use arms to lift up off the wheelchair at least hourly while up in wheelchair Additional Orders / Instructions: Follow Nutritious Diet - 70- 100 gms of protein per day. add protein drink such as Premeir Protein 2 times per day. Use the Juven for added protein intake. WOUND #1: - Ischium Wound Laterality: Right Cleanser: Soap and Water 3 x Per Week/15 Days Discharge Instructions: May shower and wash wound with dial antibacterial soap and water prior to dressing change. Cleanser: Vashe 5.8 (oz) 3 x Per Week/15 Days Discharge Instructions: Cleanse the wound with Vashe prior to applying a clean dressing using gauze sponges, not tissue or cotton balls. Peri-Wound Care: Skin Prep 3 x Per Week/15 Days Discharge Instructions: Use skin prep as directed Prim Dressing: Promogran Prisma Matrix, 4.34 (sq in) (silver collagen) 3 x Per Week/15 Days ary Discharge Instructions: Moisten collagen with saline or hydrogel Prim Dressing: NPWT 3 x Per Week/15 Days ary 03/16/2023: I can no longer get a finger over the top of his trochanter. The tissue remains pink and viable although it does tend towards the dry side, slightly. No debridement was necessary today. I am not really sure how to improve the moisture balance on the wound, as he is in a wound VAC and anything topical added would just be sucked  up into the sponge. We will continue the Prisma silver collagen on the wound bed with negative pressure wound therapy. Continue aggressive offloading and adequate protein intake. Follow-up in 2 weeks. Electronic Signature(s) Signed: 03/16/2023 9:00:24 AM By: Duanne Guess MD FACS Entered By: Duanne Guess on 03/16/2023 06:00:24 -------------------------------------------------------------------------------- HxROS Details Patient Name: Date of Service: [name], [name] 03/16/2023 8:15 A M Medical Record Number: [id number] Patient Account Number: [id number] Date of Birth/Sex: Treating RN: 11-07-2005 (17 y.o. M) Primary Care Provider: Edwina Barth Other Clinician: Referring Provider: Treating Provider/Extender: Eilene Ghazi in Treatment: 39 Information Obtained From Caregiver Chart Eyes Medical History: Negative for: Cataracts; Glaucoma; Optic Neuritis Ear/Nose/Mouth/Throat Medical History: Negative for: Chronic sinus problems/congestion; Middle ear problems Past Medical History Notes: allergic rhinitis Respiratory Medical History: Positive for: Asthma - mild Gastrointestinal Medical History: Past Medical History Notes: ostomy, bowel program Endocrine Medical HistoryMAYS, BUSSCHER ([id number]) [phone].pdf Page 9 of 10 Negative for: Type I Diabetes; Type II Diabetes Genitourinary Medical History: Negative for: End Stage Renal Disease Past Medical History Notes: neurogenic bladder Integumentary (Skin) Medical History: Negative for: History of Burn Past Medical History Notes: eczema Musculoskeletal Medical History: Past Medical History Notes: contracture of left hip, spinabifida Neurologic Medical History: 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, loloprosencephaly, hydrocephalus Oncologic Medical History: Negative for: Received Chemotherapy; Received  Radiation Psychiatric Medical  History: Negative for: Anorexia/bulimia; Confinement Anxiety Immunizations Pneumococcal Vaccine: Received Pneumococcal Vaccination: No Implantable Devices No devices added Hospitalization / Surgery History Type of Hospitalization/Surgery myringotomy hip surgery ventriculoperitoneal shunt Family and Social History Cancer: No; Diabetes: No; Heart Disease: No; Hereditary Spherocytosis: No; Hypertension: No; Kidney Disease: Yes - Father; Lung Disease: No; Seizures: No; Stroke: No; Thyroid Problems: No; Tuberculosis: No; Never smoker; Marital Status - Single; Alcohol Use: Never; Drug Use: No History; Caffeine Use: Moderate; Financial Concerns: No; Food, Clothing or Shelter Needs: No; Support System Lacking: No; Transportation Concerns: No Electronic Signature(s) Signed: 03/16/2023 9:20:12 AM By: Duanne Guess MD FACS Entered By: Duanne Guess on 03/16/2023 05:58:43 -------------------------------------------------------------------------------- SuperBill Details Patient Name: Date of Service: Cory Mount RIO [name] 03/16/2023 Medical Record Number: [id number] Patient Account Number: [id number] Date of Birth/Sex: Treating RN: 2005-06-18 (17 y.o. M) Primary Care Provider: Edwina Barth Other Clinician: Referring Provider: Treating Provider/Extender: [name] [place], [place] ([id number]) [id number] Page 10 of 10 Weeks in Treatment: 39 Diagnosis Coding ICD-10 Codes Code Description L89.314 Pressure ulcer of right buttock, stage 4 M86.152 Other acute osteomyelitis, left femur Q05.2 Lumbar spina bifida with hydrocephalus K59.2 Neurogenic bowel, not elsewhere classified N31.9 Neuromuscular dysfunction of bladder, unspecified J45.20 Mild intermittent asthma, uncomplicated Facility Procedures : CPT4 Code: 30160109 Description: 97605 - WOUND VAC-50 SQ CM OR LESS Modifier: Quantity:  1 Physician Procedures : CPT4 Code Description Modifier [id number] 99214 - WC PHYS LEVEL 4 - EST PT ICD-10 Diagnosis Description L89.314 Pressure ulcer of right buttock, stage 4 Quantity: 1 Electronic Signature(s) Signed: 03/16/2023 9:01:22 AM By: Duanne Guess MD FACS Entered By: Duanne Guess on 03/16/2023 06:01:22

## 2023-03-18 NOTE — Progress Notes (Signed)
Cory, Stark ([id number]) [id number] Page 1 of 7 Visit Report for 03/16/2023 Arrival Information Details Patient Name: Date of Service: [name] 03/16/2023 8:15 A M Medical Record Number: [id number] Patient Account Number: [id number] Date of Birth/Sex: Treating RN: Apr 07, 2006 (16 y.o. M) Primary Care Faige Seely: Edwina Barth Other Clinician: Referring Arsh Feutz: Treating Joash Tony/Extender: Eilene Ghazi in Treatment: 45 Visit Information History Since Last Visit Added or deleted any medications: No Patient Arrived: Wheel Chair Any new allergies or adverse reactions: No Arrival Time: 08:29 Had a fall or experienced change in No Accompanied By: grandma activities of daily living that may affect Transfer Assistance: None risk of falls: Patient Identification Verified: Yes Signs or symptoms of abuse/neglect since last visito No Secondary Verification Process Completed: Yes Hospitalized since last visit: No Patient Requires Transmission-Based Precautions: No Implantable device outside of the clinic excluding No Patient Has Alerts: No cellular tissue based products placed in the center since last visit: Has Dressing in Place as Prescribed: Yes Pain Present Now: No Electronic Signature(s) Signed: 03/17/2023 5:49:41 PM By: Zenaida Deed RN, BSN Entered By: Zenaida Deed on 03/16/2023 05:42:13 -------------------------------------------------------------------------------- Encounter Discharge Information Details Patient Name: Date of Service: [name], Cory RIO N L. 03/16/2023 8:15 A M Medical Record Number: [id number] Patient Account Number: [id number] Date of Birth/Sex: Treating RN: 2006-03-22 (16 y.o. [name] Primary Care Montey Ebel: Edwina Barth Other Clinician: Referring Sharae Zappulla: Treating Bracken Moffa/Extender: Eilene Ghazi in Treatment: 39 Encounter Discharge  Information Items Discharge Condition: Stable Ambulatory Status: Wheelchair Discharge Destination: Home Transportation: Private Auto Accompanied By: grandmother Schedule Follow-up Appointment: Yes Clinical Summary of Care: Patient Declined Electronic Signature(s) Signed: 03/17/2023 5:49:41 PM By: Zenaida Deed RN, BSN Entered By: Zenaida Deed on 03/16/2023 06:14:33 [name] ([id number]) [id number] Page 2 of 7 -------------------------------------------------------------------------------- Lower Extremity Assessment Details Patient Name: Date of Service: [name] 03/16/2023 8:15 A M Medical Record Number: [id number] Patient Account Number: [id number] Date of Birth/Sex: Treating RN: 2005-06-17 (16 y.o. [name] Primary Care Jadamarie Butson: Edwina Barth Other Clinician: Referring Ketura Sirek: Treating Elleanor Guyett/Extender: [name] Weeks in Treatment: 37 Electronic Signature(s) Signed: 03/17/2023 5:49:41 PM By: Zenaida Deed RN, BSN Entered By: Zenaida Deed on 03/16/2023 05:42:29 -------------------------------------------------------------------------------- Multi Wound Chart Details Patient Name: Date of Service: [name], Cory RIO N L. 03/16/2023 8:15 A M Medical Record Number: [id number] Patient Account Number: [id number] Date of Birth/Sex: Treating RN: 2005-12-20 (16 y.o. M) Primary Care Ozzie Knobel: Edwina Barth Other Clinician: Referring Merle Cirelli: Treating Marleah Beever/Extender: [name] Weeks in Treatment: 39 Vital Signs Height(in): 60 Pulse(bpm): 65 Weight(lbs): 110 Blood Pressure(mmHg): 105/61 Body Mass Index(BMI): 21.5 Temperature(F): 97.6 Respiratory Rate(breaths/min): 20 [1:Photos:] [N/A:N/A] Right Ischium N/A N/A Wound Location: Pressure Injury N/A N/A Wounding Event: Pressure Ulcer N/A N/A Primary Etiology: Asthma, Paraplegia N/A N/A Comorbid  History: 05/11/2022 N/A N/A Date Acquired: 69 N/A N/A Weeks of Treatment: Open N/A N/A Wound Status: No N/A N/A Wound Recurrence: 3x3.2x1 N/A N/A Measurements L x W x D (cm) 7.54 N/A N/A A (cm) : rea 7.54 N/A N/A Volume (cm) : -17.20% N/A N/A % Reduction in A rea: -134.50% N/A N/A % Reduction in Volume: 9 Starting Position 1 (o'clock): 4 Ending Position 1 (o'clock): 2.5 Maximum Distance 1 (cm): Yes N/A N/A Undermining: Category/Stage IV N/A N/A Classification: Medium N/A N/A Exudate A mount: Serosanguineous N/A N/A Exudate Type: red, brown N/A N/A Exudate Color: Well defined, not attached N/A  N/A Wound MarginKANON, [name] ([id number]) [id number] Page 3 of 7 Large (67-100%) N/A N/A Granulation Amount: Red, Pink, Pale N/A N/A Granulation Quality: Small (1-33%) N/A N/A Necrotic Amount: Fat Layer (Subcutaneous Tissue): Yes N/A N/A Exposed Structures: Fascia: No Tendon: No Muscle: No Joint: No Bone: No None N/A N/A Epithelialization: No Abnormalities Noted N/A N/A Periwound Skin Texture: No Abnormalities Noted N/A N/A Periwound Skin Moisture: No Abnormalities Noted N/A N/A Periwound Skin Color: No Abnormality N/A N/A Temperature: Negative Pressure Wound Therapy N/A N/A Procedures Performed: Maintenance (NPWT) Treatment Notes Electronic Signature(s) Signed: 03/16/2023 8:57:49 AM By: Duanne Guess MD FACS Entered By: Duanne Guess on 03/16/2023 05:57:49 -------------------------------------------------------------------------------- Multi-Disciplinary Care Plan Details Patient Name: Date of Service: [name], Cory RIO N L. 03/16/2023 8:15 A M Medical Record Number: [id number] Patient Account Number: [id number] Date of Birth/Sex: Treating RN: [birth year] (16 y.o. [name] Primary Care Cadin Luka: Edwina Barth Other Clinician: Referring Joel Mericle: Treating Michille Mcelrath/Extender: Eilene Ghazi in Treatment: 39 Multidisciplinary Care Plan reviewed with physician Active Inactive Pressure Nursing Diagnoses: Knowledge deficit related to causes and risk factors for pressure ulcer development Knowledge deficit related to management of pressures ulcers Potential for impaired tissue integrity related to pressure, friction, moisture, and shear Goals: Patient/caregiver will verbalize understanding of pressure ulcer management Date Initiated: 06/14/2022 Target Resolution Date: 03/29/2023 Goal Status: Active Interventions: Assess: immobility, friction, shearing, incontinence upon admission and as needed Assess offloading mechanisms upon admission and as needed Assess potential for pressure ulcer upon admission and as needed Treatment Activities: Patient referred for seating evaluation to ensure proper offloading : 06/14/2022 Pressure reduction/relief device ordered : 06/14/2022 Notes: Wound/Skin Impairment Nursing Diagnoses: Impaired tissue integrity Knowledge deficit related to ulceration/compromised skin integrity Goals: Patient/caregiver will verbalize understanding of skin care regimen BLADE, SCHEFF ([id number]) [id number] Page 4 of 7 Date Initiated: 06/14/2022 Target Resolution Date: 03/29/2023 Goal Status: Active Ulcer/skin breakdown will have a volume reduction of 30% by week 4 Date Initiated: 06/14/2022 Date Inactivated: 07/15/2022 Target Resolution Date: 09/14/2022 Unmet Reason: requires new w/c Goal Status: Unmet cushion Ulcer/skin breakdown will have a volume reduction of 50% by week 8 Date Initiated: 07/15/2022 Date Inactivated: 09/16/2022 Target Resolution Date: 08/12/2022 Goal Status: Unmet Unmet Reason: infection Interventions: Assess patient/caregiver ability to obtain necessary supplies Assess patient/caregiver ability to perform ulcer/skin care regimen upon admission and as needed Assess ulceration(s) every  visit Provide education on ulcer and skin care Treatment Activities: Skin care regimen initiated : 06/14/2022 Topical wound management initiated : 06/14/2022 Notes: Electronic Signature(s) Signed: 03/17/2023 5:49:41 PM By: Zenaida Deed RN, BSN Entered By: Zenaida Deed on 03/16/2023 05:48:12 -------------------------------------------------------------------------------- Negative Pressure Wound Therapy Maintenance (NPWT) Details Patient Name: Date of Service: [name] 03/16/2023 8:15 A M Medical Record Number: [id number] Patient Account Number: [id number] Date of Birth/Sex: Treating RN: 21-Jan-2006 (16 y.o. [name] Primary Care Gerturde Kuba: Edwina Barth Other Clinician: Referring Tony Friscia: Treating Maiko Salais/Extender: [name] Weeks in Treatment: 39 NPWT Maintenance Performed for: Wound #1 Right Ischium Performed By: Zenaida Deed, RN Type: Medela [employer] Coverage Size (sq cm): 9.6 Pressure Type: Constant Pressure Setting: 125 mmHG Drain Type: None Primary Contact: Other : silver collagen Sponge/Dressing Type: Foam- Black Date Initiated: 08/26/2022 Dressing Removed: Yes Quantity of Sponges/Gauze Removed: 1 Canister Changed: No Dressing Reapplied: Yes Quantity of Sponges/Gauze Inserted: 1 Respones T Treatment: o good Days On NPWT : 203 Post Procedure Diagnosis Same as Pre-procedure Electronic Signature(s) Signed: 03/17/2023 5:49:41 PM By: Daneil Dan,  Bonita Quin RN, BSN Entered By: Zenaida Deed on 03/16/2023 05:55:54 [name] ([id number]) [id number] Page 5 of 7 -------------------------------------------------------------------------------- Pain Assessment Details Patient Name: Date of Service: [name] 03/16/2023 8:15 A M Medical Record Number: [id number] Patient Account Number: [id number] Date of Birth/Sex: Treating RN: 11-24-2005 (16 y.o. M) Primary Care Maia Handa:  Edwina Barth Other Clinician: Referring Indigo Barbian: Treating Nea Gittens/Extender: [name] Weeks in Treatment: 39 Active Problems Location of Pain Severity and Description of Pain Patient Has Paino No Site Locations Pain Management and Medication Current Pain Management: Electronic Signature(s) Signed: 03/17/2023 5:49:41 PM By: Zenaida Deed RN, BSN Entered By: Zenaida Deed on 03/16/2023 05:42:22 -------------------------------------------------------------------------------- Patient/Caregiver Education Details Patient Name: Date of Service: [name] 10/30/2024andnbsp8:15 A M Medical Record Number: [id number] Patient Account Number: [id number] Date of Birth/Gender: Treating RN: [birth year] (17 y.o. [name] Primary Care Physician: Edwina Barth Other Clinician: Referring Physician: Treating Physician/Extender: Eilene Ghazi in Treatment: 81 Education Assessment Education Provided To: Patient Education Topics Provided Pressure: Methods: Explain/Verbal Responses: Reinforcements needed, State content correctly Wound/Skin Impairment: Methods: Explain/Verbal Responses: Reinforcements needed, State content correctly [name], [name] ([id number]) [id number] Page 6 of 7 Electronic Signature(s) Signed: 03/17/2023 5:49:41 PM By: Zenaida Deed RN, BSN Entered By: Zenaida Deed on 03/16/2023 05:48:32 -------------------------------------------------------------------------------- Wound Assessment Details Patient Name: Date of Service: Cory Mount RIO N L. 03/16/2023 8:15 A M Medical Record Number: [id number] Patient Account Number: [id number] Date of Birth/Sex: Treating RN: 2005/11/01 (16 y.o. M) Primary Care Sharnay Cashion: Edwina Barth Other Clinician: Referring Tommi Crepeau: Treating Latora Quarry/Extender: [name] Weeks in Treatment: 39 Wound  Status Wound Number: 1 Primary Etiology: Pressure Ulcer Wound Location: Right Ischium Wound Status: Open Wounding Event: Pressure Injury Comorbid History: Asthma, Paraplegia Date Acquired: 05/11/2022 Weeks Of Treatment: 39 Clustered Wound: No Photos Wound Measurements Length: (cm) 3 Width: (cm) 3.2 Depth: (cm) 1 Area: (cm) 7.54 Volume: (cm) 7.54 % Reduction in Area: -17.2% % Reduction in Volume: -134.5% Epithelialization: None Tunneling: No Undermining: Yes Starting Position (o'clock): 9 Ending Position (o'clock): 4 Maximum Distance: (cm) 2.5 Wound Description Classification: Category/Stage IV Wound Margin: Well defined, not attached Exudate Amount: Medium Exudate Type: Serosanguineous Exudate Color: red, brown Foul Odor After Cleansing: No Slough/Fibrino No Wound Bed Granulation Amount: Large (67-100%) Exposed Structure Granulation Quality: Red, Pink, Pale Fascia Exposed: No Necrotic Amount: Small (1-33%) Fat Layer (Subcutaneous Tissue) Exposed: Yes Necrotic Quality: Adherent Slough Tendon Exposed: No Muscle Exposed: No Joint Exposed: No Bone Exposed: No Periwound Skin Texture Texture Color No Abnormalities Noted: Yes No Abnormalities Noted: Yes Moisture Temperature / Pain Cory, Stark ([id number]) [id number] Page 7 of 7 No Abnormalities Noted: Yes Temperature: No Abnormality Treatment Notes Wound #1 (Ischium) Wound Laterality: Right Cleanser Soap and Water Discharge Instruction: May shower and wash wound with dial antibacterial soap and water prior to dressing change. Vashe 5.8 (oz) Discharge Instruction: Cleanse the wound with Vashe prior to applying a clean dressing using gauze sponges, not tissue or cotton balls. Peri-Wound Care Skin Prep Discharge Instruction: Use skin prep as directed Topical Primary Dressing Promogran Prisma Matrix, 4.34 (sq in) (silver collagen) Discharge Instruction: Moisten collagen with saline  or hydrogel NPWT Secondary Dressing Secured With Compression Wrap Compression Stockings Add-Ons Electronic Signature(s) Signed: 03/17/2023 5:49:41 PM By: Zenaida Deed RN, BSN Entered By: Zenaida Deed on 03/16/2023 05:46:43 -------------------------------------------------------------------------------- Vitals Details Patient Name: Date of Service: [name], Cory RIO N L. 03/16/2023 8:15 A M  Medical Record Number: [id number] Patient Account Number: [id number] Date of Birth/Sex: Treating RN: January 15, 2006 (16 y.o. M) Primary Care Elazar Argabright: Edwina Barth Other Clinician: Referring Makinze Jani: Treating Annalysia Willenbring/Extender: [name] Weeks in Treatment: 39 Vital Signs Time Taken: 08:30 Temperature (F): 97.6 Height (in): 60 Pulse (bpm): 65 Weight (lbs): 110 Respiratory Rate (breaths/min): 20 Body Mass Index (BMI): 21.5 Blood Pressure (mmHg): 105/61 Reference Range: 80 - 120 mg / dl Electronic Signature(s) Signed: 03/17/2023 5:49:41 PM By: Zenaida Deed RN, BSN Entered By: Zenaida Deed on 03/16/2023 05:42:18

## 2023-03-30 ENCOUNTER — Encounter (HOSPITAL_BASED_OUTPATIENT_CLINIC_OR_DEPARTMENT_OTHER): Payer: Medicaid Other | Attending: General Surgery | Admitting: General Surgery

## 2023-03-30 DIAGNOSIS — Q052 Lumbar spina bifida with hydrocephalus: Secondary | ICD-10-CM | POA: Insufficient documentation

## 2023-03-30 DIAGNOSIS — G8220 Paraplegia, unspecified: Secondary | ICD-10-CM | POA: Insufficient documentation

## 2023-03-30 DIAGNOSIS — M86152 Other acute osteomyelitis, left femur: Secondary | ICD-10-CM | POA: Insufficient documentation

## 2023-03-30 DIAGNOSIS — L89314 Pressure ulcer of right buttock, stage 4: Secondary | ICD-10-CM | POA: Insufficient documentation

## 2023-03-30 DIAGNOSIS — N319 Neuromuscular dysfunction of bladder, unspecified: Secondary | ICD-10-CM | POA: Insufficient documentation

## 2023-03-30 DIAGNOSIS — J4520 Mild intermittent asthma, uncomplicated: Secondary | ICD-10-CM | POA: Diagnosis not present

## 2023-03-30 DIAGNOSIS — K592 Neurogenic bowel, not elsewhere classified: Secondary | ICD-10-CM | POA: Diagnosis not present

## 2023-03-30 NOTE — Progress Notes (Signed)
MEREL, KARAMAN ([id number]) [id number] Page 1 of 10 Visit Report for 03/30/2023 Chief Complaint Document Details Patient Name: Date of Service: [name] 03/30/2023 11:15 A M Medical Record Number: [id number] Patient Account Number: [id number] Date of Birth/Sex: Treating RN: Dec 24, 2005 (17 y.o. M) Primary Care Provider: Edwina Barth Other Clinician: Referring Provider: Treating Provider/Extender: Eilene Ghazi in Treatment: 31 Information Obtained from: Patient Chief Complaint Patient is at the clinic for treatment of an open pressure ulcer Electronic Signature(s) Signed: 03/30/2023 12:01:57 PM By: Duanne Guess MD FACS Entered By: Duanne Guess on 03/30/2023 09:01:57 -------------------------------------------------------------------------------- HPI Details Patient Name: Date of Service: [name], [name] 03/30/2023 11:15 A M Medical Record Number: [id number] Patient Account Number: [id number] Date of Birth/Sex: Treating RN: Oct 07, 2005 (17 y.o. 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 Around Christmas time, he developed a pressure ulcer on his right ischium. This seems to be related to the cushioning in his wheelchair. They have an appointment coming up on Wednesday to have this checked and addressed. For some reason he has been prescribed doxycycline and Flagyl and has a couple days left of this. They have just been covering the site with gauze. On his right ischial area, he has an oval wound that extends into the fat layer. There is some slough and fibrinous exudate present on the surface. There  is no erythema, induration, malodor, or purulent drainage to suggest infection. 06/21/2022: The wound measured larger and deeper today. There is evidence of pressure induced tissue injury and the surface is a bit dry with fibrinous exudate accumulation. He does not yet have a new cushion for his wheelchair. 06/30/2022: His wound is deeper again. The surface is very clean. They did obtain the eggcrate cushion, but did not cut out an area to offload his ulcer. 07/07/2022: His wound measured a little bit smaller today. There is slough on the wound surface. For some reason they did not get the Iodosorb gel and have been using Iodosorb pads; I do not think these are doing as good a job of chemical debridement as the gel would. They did cut out the space in his eggcrate foam cushion to accommodate his wound and I think this is helpful. 07/15/2022: His wound is deeper today. The tissue is pale, but the wound is fairly clean. For reasons that remain unclear, his wheelchair cushion has not been replaced and his caregivers have not contacted [employer] to do anything about it. 07/20/2022: His wound is a little bit smaller. There is still a lot of undermining. He has more slough accumulation today. We ordered him a Roho cushion last week, but he has not yet received it. 3/13; patient presents for follow-up. He has been using Hydrofera Blue to the right ischium wound bed. He has no issues or complaints today. 08/05/2022: The color of the tissue in the wound bed has improved markedly. It is much more beefy and red, whereas previously it has been quite pale. There is some slough on the wound surface. He finally got his custom molded wheelchair cushion for his school chair and a Roho cushion for home. There was an error on the part of the DME company for his low-air-loss mattress bed, but this is  in the process of being resolved. ELRY, SLUYTER ([id number]) [id number] Page 2 of 10 08/26/2022:  The wound is measuring a little deeper; it looks like some fat simply separated in the center of his wound, rather than worsening of the pressure injury. The quality of the tissue continues to improve. His low-air-loss mattress was finally delivered. He has his wound VAC with him for application today. 09/02/2022: The wound continues to worsen. I can palpate his trochanter in the deepest part of the wound, although the bone is not exposed. There is a strong odor coming from the wound and the tissue surface is gray. 09/09/2022: The progression of the wound seems to have been arrested. The color is markedly improved and is now beefy red. There are still some areas of the tissue that are more purpleish, suggesting ongoing pressure induced injury. The odor has abated. The culture that I took produced a polymicrobial population including Proteus, Pseudomonas, and multiple other species. He is currently taking levofloxacin and metronidazole. 09/16/2022: The wound continues to improve. The color and is red and the surface is appropriately moist. He has been in his Roho cushion and I do not see any areas of further pressure induced injury. He is completing his course of oral levofloxacin and Flagyl. 09/23/2022: No pressure induced tissue damage this week. There is no odor coming from the wound. No significant changes to the wound dimensions, however. 09/29/2022: The wound size remains about the same. The tissues are looking a bit healthier. There is slough on the wound surface. 10/07/2022: The wound is looking better. The undermining at 12:00 has closed down. The tunneling that extends over the trochanter is still present, but is tighter and narrower. The tissues look healthier. There is an odor, however, on the dressing. 10/14/2022: There has been very nice improvement in the wound over the past week. The tunneling of the trochanter is shorter by a good half centimeter and the tissue is much tighter. Near 12:00 the  wound is nearly flush with the surrounding skin surface. The odor has resolved. 10/25/2022: He had his wound VAC off for graduation and there has been some breakdown over the trochanter. The bone is not yet exposed, but the tissue is thinner. There is a layer of slough on the wound surface. 11/03/2022: There has been nice improvement since our last visit. The tissue over the trochanter has filled again and the space is contracting. The granulation tissue appears healthy and there is no slough. No evidence of ongoing pressure-induced tissue injury. 11/09/2022: The wound continues to improve. The undermined area is filling in more, but the tissue over the trochanter is still quite thin. There is minimal biofilm on the surface and no evidence of pressure induced tissue injury today. 11/16/2022: The undermined portion of the wound has filled in even more and the tissue over the trochanter is starting to get a bit thicker. Everything is extremely clean today without any odor or slough accumulation. 11/22/2022: The undermined area of the wound continues to contract. The wound is very clean. The tissue is robust and beefy red. 12/14/2022: The undermined area has come in by almost a cm. The tissue over the trochanter feels thicker. The wound is clean without any odor. 01/03/2023: The wound is stable, but there is some purpleish discoloration directly over the ischium consistent with pressure in this area. He has a little bit of slough accumulation around the edges of the wound. There is also an odor to the wound, despite use of  topical gentamicin and mupirocin. 01/19/2023: The wound has filled in and the cavity is not as deep. The area overlying the trochanter is closing in. I do not appreciate any pressure-related tissue injury. There is some slough on the surface. The odor has abated. He is currently still taking the Augmentin I prescribed in response to his culture data. The x- ray that was done was concerning for  possible osteomyelitis and he has had an MRI, but this has not yet been read. His albumin is normal. The referral to plastic surgery is on hold while we complete this testing and I suspect he will require treatment for osteomyelitis before they will agree to see him. 02/03/2023: The wound continues to contract. No pressure-related tissue injury. No odor. He saw infectious disease yesterday for osteomyelitis and will receive Dalvance infusions on a weekly basis. He does have an appointment to see plastics at [facility] coming up on the 26th of this month. 02/16/2023: There is senescent skin that has built up around the wound edges and some biofilm on the surface. The area overlying the trochanter has gotten more closed in and tighter. The external wound dimensions are about the same. He has completed his Dalvance treatment. When he saw plastic surgery last week, they recommended pressure mapping and intervention with different offloading including a new seat cushion, a reclining wheelchair to help a lot of the ischium and to decrease the amount of time he spends in his wheelchair. He was advised to spend most of his time lying in bed. He was advised to offload the area completely for 3 months and allow the [facility] more time to work. 03/01/2023: The wound is extremely clean today and is getting shallower with less undermining. The area over the trochanter is quite a bit tighter. There is no evidence of pressure induced tissue injury. 03/16/2023: I can no longer get a finger over the top of his trochanter. The tissue remains pink and viable although it does tend towards the dry side, slightly. 03/30/2023: The undermined area continues to contract, but there has been deterioration at the center of the wound directly overlying the ischium. Bone is not frankly exposed yet, but I am concerned this may occur if there is further deterioration. There is also a small tunnel that seems to be just separation of the muscle  layers at about the 12 o'clock position of the wound. It is difficult to find with a skinny probe. There is no malodor or purulent drainage. Electronic Signature(s) Signed: 03/30/2023 12:03:18 PM By: Duanne Guess MD FACS Entered By: Duanne Guess on 03/30/2023 09:03:17 -------------------------------------------------------------------------------- Physical Exam Details Patient Name: Date of Service: [name], [name] 03/30/2023 11:15 A M Medical Record Number: [id number] Patient Account Number: [id number] Date of Birth/Sex: Treating RN: 2006/02/14 (16 y.o. M) Primary Care Provider: Edwina Barth Other Clinician: Referring Provider: Treating Provider/Extender: [name] Weeks in Treatment: [address] [place] ([id number]) [id number] Page 3 of 10 Constitutional . . . . no acute distress. Respiratory Normal work of breathing on room air.. Notes 03/30/2023: The undermined area continues to contract, but there has been deterioration at the center of the wound directly overlying the ischium. There is also a small tunnel that seems to be just separation of the muscle layers at about the 12 o'clock position of the wound. There is no malodor or purulent drainage. Electronic Signature(s) Signed: 03/30/2023 12:04:17 PM By: Duanne Guess MD FACS Entered By: Duanne Guess on 03/30/2023 09:04:17 -------------------------------------------------------------------------------- Physician  Orders Details Patient Name: Date of Service: Sheryn Bison 03/30/2023 11:15 A M Medical Record Number: [id number] Patient Account Number: [id number] Date of Birth/Sex: Treating RN: 2006-01-31 (16 y.o. 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 - Dr. Lady Gary RM 1 Return A Wed 11/26 @ 11:15 am Anesthetic Wound #1 Right Ischium (In clinic) Topical Lidocaine 4% applied to wound bed Bathing/ Shower/ Hygiene May shower and wash wound with soap and water. Negative Presssure Wound Therapy Medela Wound Vac continuously at 161mm/hg Black Foam Off-Loading Low air-loss mattress (Group 2) - ordered through [employer] Roho cushion for wheelchair - sent referral to [employer] Turn and reposition every 2 hours - use arms to lift up off the wheelchair at least hourly while up in wheelchair Additional Orders / Instructions Follow Nutritious Diet - 70- 100 gms of protein per day. add protein drink such as Premeir Protein 2 times per day. Use the Juven for added protein intake. Wound Treatment Wound #1 - Ischium Wound Laterality: Right Cleanser: Soap and Water 3 x Per Week/15 Days Discharge Instructions: May shower and wash wound with dial antibacterial soap and water prior to dressing change. Cleanser: Vashe 5.8 (oz) 3 x Per Week/15 Days Discharge Instructions: Cleanse the wound with Vashe prior to applying a clean dressing using gauze sponges, not tissue or cotton balls. CADDEN, DETLOFF ([id number]) [id number] Page 4 of 10 Peri-Wound Care: Skin Prep 3 x Per Week/15 Days Discharge Instructions: Use skin prep as directed Prim Dressing: Promogran Prisma Matrix, 4.34 (sq in) (silver collagen) 3 x Per Week/15 Days ary Discharge Instructions: Moisten collagen with saline or hydrogel Prim Dressing: NPWT ary 3 x Per Week/15 Days Electronic  Signature(s) Signed: 03/30/2023 12:34:47 PM By: Duanne Guess MD FACS Entered By: Duanne Guess on 03/30/2023 09:04:27 Prescription 03/30/2023 -------------------------------------------------------------------------------- [name] Duanne Guess MD Patient Name: Provider: 07-06-2005 [id number] Date of Birth: NPI#: Judie Petit [id number] Sex: DEA #: 2012 Phone #: License #: UPN: Patient Address: [address] Eligha Bridegroom [facility] [place], [place] [address] [address] [place], [place] [address] [phone] Allergies latex Provider's Orders Roho cushion for wheelchair - sent referral to [employer] Hand Signature: Date(s): Electronic Signature(s) Signed: 03/30/2023 12:34:47 PM By: Duanne Guess MD FACS Entered By: Duanne Guess on 03/30/2023 09:04:28 -------------------------------------------------------------------------------- Problem List Details Patient Name: Date of Service: [name], [name] 03/30/2023 11:15 A M Medical Record Number: [id number] Patient Account Number: [id number] Date of Birth/Sex: Treating RN: April 24, 2006 (16 y.o. 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 03/30/2023 11:15 A M Medical Record Number: [id number] Patient Account Number: [id number] Date of Birth/Sex: Treating RN: [birth year] (16 y.o. 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 Around Christmas time, he developed a pressure ulcer on his right ischium. This seems to be related to the cushioning in his wheelchair. They have an appointment coming up on Wednesday to have this checked and addressed. For some reason he has been prescribed doxycycline and Flagyl and has a couple days left of this. They have just been covering the site with gauze. On his right ischial area, he has an oval wound that extends into the fat layer. There is some slough and fibrinous exudate present on the surface. There is no erythema, induration, malodor, or purulent drainage to suggest infection. 06/21/2022: The wound measured larger and deeper today. There is evidence of pressure induced tissue injury and the surface is a bit dry with fibrinous exudate accumulation. He does not yet have a new cushion for his wheelchair. 06/30/2022: His wound is deeper again. The surface is very clean. They did obtain the eggcrate cushion, but did not cut out an area to offload his ulcer. 07/07/2022: His wound measured a  little bit smaller today. There is slough on the wound surface. For some reason they did not get the Iodosorb gel and have been using Iodosorb pads; I do not think these are doing as good a job of chemical debridement as the gel would. They did cut out the space in his eggcrate foam cushion to accommodate his wound and I think this is helpful. 07/15/2022: His wound is deeper today. The tissue is pale, but the wound is fairly clean. For reasons that remain unclear, his wheelchair cushion has not been replaced and his caregivers have not contacted [employer] to do anything about it. DAYLAN, GAWRON ([id number]) [id number] Page 6 of 10 07/20/2022: His wound is a little bit smaller. There is still a lot of undermining. He has more slough accumulation today. We ordered him a Roho cushion last week, but he has not yet received it. 3/13; patient presents for follow-up. He has been using Hydrofera Blue to the right ischium wound bed. He has no issues or complaints today. 08/05/2022: The color of the tissue in the wound bed has improved markedly. It is much more beefy and red, whereas previously it has been quite pale. There is some slough on the wound surface. He finally got his custom molded wheelchair cushion for his school chair and a Roho cushion for home. There was an error on the part of the DME company for his low-air-loss mattress bed, but this is in the process of being resolved. 08/26/2022: The wound is measuring a little deeper; it looks like some fat simply separated in the center of his wound, rather than worsening of the pressure injury. The quality of the tissue continues to improve. His low-air-loss mattress was finally delivered. He has his wound VAC  with him for application today. 09/02/2022: The wound continues to worsen. I can palpate his trochanter in the deepest part of the wound, although the bone is not exposed. There is a strong odor coming from the wound and the  tissue surface is gray. 09/09/2022: The progression of the wound seems to have been arrested. The color is markedly improved and is now beefy red. There are still some areas of the tissue that are more purpleish, suggesting ongoing pressure induced injury. The odor has abated. The culture that I took produced a polymicrobial population including Proteus, Pseudomonas, and multiple other species. He is currently taking levofloxacin and metronidazole. 09/16/2022: The wound continues to improve. The color and is red and the surface is appropriately moist. He has been in his Roho cushion and I do not see any areas of further pressure induced injury. He is completing his course of oral levofloxacin and Flagyl. 09/23/2022: No pressure induced tissue damage this week. There is no odor coming from the wound. No significant changes to the wound dimensions, however. 09/29/2022: The wound size remains about the same. The tissues are looking a bit healthier. There is slough on the wound surface. 10/07/2022: The wound is looking better. The undermining at 12:00 has closed down. The tunneling that extends over the trochanter is still present, but is tighter and narrower. The tissues look healthier. There is an odor, however, on the dressing. 10/14/2022: There has been very nice improvement in the wound over the past week. The tunneling of the trochanter is shorter by a good half centimeter and the tissue is much tighter. Near 12:00 the wound is nearly flush with the surrounding skin surface. The odor has resolved. 10/25/2022: He had his wound VAC off for graduation and there has been some breakdown over the trochanter. The bone is not yet exposed, but the tissue is thinner. There is a layer of slough on the wound surface. 11/03/2022: There has been nice improvement since our last visit. The tissue over the trochanter has filled again and the space is contracting. The granulation tissue appears healthy and there is no slough.  No evidence of ongoing pressure-induced tissue injury. 11/09/2022: The wound continues to improve. The undermined area is filling in more, but the tissue over the trochanter is still quite thin. There is minimal biofilm on the surface and no evidence of pressure induced tissue injury today. 11/16/2022: The undermined portion of the wound has filled in even more and the tissue over the trochanter is starting to get a bit thicker. Everything is extremely clean today without any odor or slough accumulation. 11/22/2022: The undermined area of the wound continues to contract. The wound is very clean. The tissue is robust and beefy red. 12/14/2022: The undermined area has come in by almost a cm. The tissue over the trochanter feels thicker. The wound is clean without any odor. 01/03/2023: The wound is stable, but there is some purpleish discoloration directly over the ischium consistent with pressure in this area. He has a little bit of slough accumulation around the edges of the wound. There is also an odor to the wound, despite use of topical gentamicin and mupirocin. 01/19/2023: The wound has filled in and the cavity is not as deep. The area overlying the trochanter is closing in. I do not appreciate any pressure-related tissue injury. There is some slough on the surface. The odor has abated. He is currently still taking the Augmentin I prescribed in response to his culture data. The x- ray  that was done was concerning for possible osteomyelitis and he has had an MRI, but this has not yet been read. His albumin is normal. The referral to plastic surgery is on hold while we complete this testing and I suspect he will require treatment for osteomyelitis before they will agree to see him. 02/03/2023: The wound continues to contract. No pressure-related tissue injury. No odor. He saw infectious disease yesterday for osteomyelitis and will receive Dalvance infusions on a weekly basis. He does have an appointment to see  plastics at [facility] coming up on the 26th of this month. 02/16/2023: There is senescent skin that has built up around the wound edges and some biofilm on the surface. The area overlying the trochanter has gotten more closed in and tighter. The external wound dimensions are about the same. He has completed his Dalvance treatment. When he saw plastic surgery last week, they recommended pressure mapping and intervention with different offloading including a new seat cushion, a reclining wheelchair to help a lot of the ischium and to decrease the amount of time he spends in his wheelchair. He was advised to spend most of his time lying in bed. He was advised to offload the area completely for 3 months and allow the [facility] more time to work. 03/01/2023: The wound is extremely clean today and is getting shallower with less undermining. The area over the trochanter is quite a bit tighter. There is no evidence of pressure induced tissue injury. 03/16/2023: I can no longer get a finger over the top of his trochanter. The tissue remains pink and viable although it does tend towards the dry side, slightly. 03/30/2023: The undermined area continues to contract, but there has been deterioration at the center of the wound directly overlying the ischium. Bone is not frankly exposed yet, but I am concerned this may occur if there is further deterioration. There is also a small tunnel that seems to be just separation of the muscle layers at about the 12 o'clock position of the wound. It is difficult to find with a skinny probe. There is no malodor or purulent drainage. Patient History Information obtained from Caregiver, Chart. Family History Kidney Disease - Father, No family history of Cancer, Diabetes, Heart Disease, Hereditary Spherocytosis, Hypertension, Lung Disease, Seizures, Stroke, Thyroid Problems, Tuberculosis. Social History Never smoker, Marital Status - Single, Alcohol Use - Never, Drug Use - No History,  Caffeine Use - Moderate. Medical History Eyes Denies history of Cataracts, Glaucoma, Optic Neuritis Ear/Nose/Mouth/Throat Denies history of Chronic sinus problems/congestion, Middle ear problems LAYMON, MAHALA ([id number]) [id number] Page 7 of 10 Respiratory Patient has history of Asthma - mild Endocrine Denies history of Type I Diabetes, Type II Diabetes Genitourinary Denies history of End Stage Renal Disease Integumentary (Skin) Denies history of History of Burn Neurologic Patient has history of Paraplegia Oncologic Denies history of Received Chemotherapy, Received Radiation Psychiatric Denies history of Anorexia/bulimia, Confinement Anxiety Hospitalization/Surgery History - myringotomy. - hip surgery. - ventriculoperitoneal shunt. Medical A Surgical History Notes nd Ear/Nose/Mouth/Throat allergic rhinitis Gastrointestinal ostomy, bowel program Genitourinary neurogenic bladder Integumentary (Skin) eczema Musculoskeletal contracture of left hip, spinabifida Neurologic developmental delay, loloprosencephaly, hydrocephalus Objective Constitutional no acute distress. Vitals Time Taken: 11:14 AM, Height: 60 in, Weight: 110 lbs, BMI: 21.5, Temperature: 99.3 F, Pulse: 72 bpm, Respiratory Rate: 20 breaths/min, Blood Pressure: 107/69 mmHg. Respiratory Normal work of breathing on room air.. General Notes: 03/30/2023: The undermined area continues to contract, but there has been deterioration at the center of the wound  directly overlying the ischium. There is also a small tunnel that seems to be just separation of the muscle layers at about the 12 o'clock position of the wound. There is no malodor or purulent drainage. Integumentary (Hair, Skin) Wound #1 status is Open. Original cause of wound was Pressure Injury. The date acquired was: 05/11/2022. The wound has been in treatment 41 weeks. The wound is located on the Right Ischium. The wound  measures 2.6cm length x 3.8cm width x 1cm depth; 7.76cm^2 area and 7.76cm^3 volume. There is muscle and Fat Layer (Subcutaneous Tissue) exposed. Tunneling has been noted at 12:00 with a maximum distance of 4.6cm. Undermining begins at 12:00 and ends at 6:00 with a maximum distance of 2.1cm. There is a medium amount of serosanguineous drainage noted. The wound margin is well defined and not attached to the wound base. There is large (67-100%) red, pink, pale granulation within the wound bed. There is a small (1-33%) amount of necrotic tissue within the wound bed. The periwound skin appearance had no abnormalities noted for texture. The periwound skin appearance had no abnormalities noted for moisture. The periwound skin appearance had no abnormalities noted for color. Periwound temperature was noted as No Abnormality. Assessment Active Problems ICD-10 Pressure ulcer of right buttock, stage 4 Other acute osteomyelitis, left femur Lumbar spina bifida with hydrocephalus Neurogenic bowel, not elsewhere classified Neuromuscular dysfunction of bladder, unspecified Mild intermittent asthma, uncomplicated Plan JAYSEON, NEARHOOD ([id number]) [id number] Page 8 of 10 Follow-up Appointments: Return Appointment in 2 weeks. - Dr. Lady Gary RM 1 Wed 11/26 @ 11:15 am Anesthetic: Wound #1 Right Ischium: (In clinic) Topical Lidocaine 4% applied to wound bed Bathing/ Shower/ Hygiene: May shower and wash wound with soap and water. Negative Presssure Wound Therapy: Medela Wound Vac continuously at 142mm/hg Black Foam Off-Loading: Low air-loss mattress (Group 2) - ordered through [employer] Roho cushion for wheelchair - sent referral to [employer] Turn and reposition every 2 hours - use arms to lift up off the wheelchair at least hourly while up in wheelchair Additional Orders / Instructions: Follow Nutritious Diet - 70- 100 gms of protein per day. add protein drink such as Premeir  Protein 2 times per day. Use the Juven for added protein intake. WOUND #1: - Ischium Wound Laterality: Right Cleanser: Soap and Water 3 x Per Week/15 Days Discharge Instructions: May shower and wash wound with dial antibacterial soap and water prior to dressing change. Cleanser: Vashe 5.8 (oz) 3 x Per Week/15 Days Discharge Instructions: Cleanse the wound with Vashe prior to applying a clean dressing using gauze sponges, not tissue or cotton balls. Peri-Wound Care: Skin Prep 3 x Per Week/15 Days Discharge Instructions: Use skin prep as directed Prim Dressing: Promogran Prisma Matrix, 4.34 (sq in) (silver collagen) 3 x Per Week/15 Days ary Discharge Instructions: Moisten collagen with saline or hydrogel Prim Dressing: NPWT 3 x Per Week/15 Days ary 03/30/2023: The undermined area continues to contract, but there has been deterioration at the center of the wound directly overlying the ischium. Bone is not frankly exposed yet, but I am concerned this may occur if there is further deterioration. There is also a small tunnel that seems to be just separation of the muscle layers at about the 12 o'clock position of the wound. It is difficult to find with a skinny probe. There is no malodor or purulent drainage. No debridement was necessary today. We discussed the need for better offloading with the patient and his grandmother. We will continue negative pressure  wound therapy using Prisma silver collagen in the wound bed. Follow-up in 2 weeks. Electronic Signature(s) Signed: 03/30/2023 12:05:09 PM By: Duanne Guess MD FACS Entered By: Duanne Guess on 03/30/2023 09:05:09 -------------------------------------------------------------------------------- HxROS Details Patient Name: Date of Service: [name], [name] 03/30/2023 11:15 A M Medical Record Number: [id number] Patient Account Number: [id number] Date of Birth/Sex: Treating RN: 04/24/2006 (16 y.o. M) Primary Care Provider: Edwina Barth Other Clinician: Referring Provider: Treating Provider/Extender: Eilene Ghazi in Treatment: 76 Information Obtained From Caregiver Chart Eyes Medical History: Negative for: Cataracts; Glaucoma; Optic Neuritis Ear/Nose/Mouth/Throat Medical History: Negative for: Chronic sinus problems/congestion; Middle ear problems Past Medical History Notes: allergic rhinitis Respiratory Medical History: Positive for: Asthma - mild Gastrointestinal INGVALD, BAYONA ([id number]) [id number] Page 9 of 10 Medical History: Past Medical History Notes: ostomy, bowel program Endocrine Medical History: Negative for: Type I Diabetes; Type II Diabetes Genitourinary Medical History: Negative for: End Stage Renal Disease Past Medical History Notes: neurogenic bladder Integumentary (Skin) Medical History: Negative for: History of Burn Past Medical History Notes: eczema Musculoskeletal Medical History: Past Medical History Notes: contracture of left hip, spinabifida Neurologic Medical History: 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, loloprosencephaly, hydrocephalus Oncologic Medical History: Negative for: Received Chemotherapy; Received Radiation Psychiatric Medical History: Negative for: Anorexia/bulimia; Confinement Anxiety Immunizations Pneumococcal Vaccine: Received Pneumococcal Vaccination: No Implantable Devices No devices added Hospitalization / Surgery History Type of Hospitalization/Surgery myringotomy hip surgery ventriculoperitoneal shunt Family and Social History Cancer: No; Diabetes: No; Heart Disease: No; Hereditary Spherocytosis: No; Hypertension: No; Kidney Disease: Yes - Father; Lung Disease: No; Seizures: No; Stroke: No; Thyroid Problems: No; Tuberculosis: No; Never smoker; Marital Status - Single; Alcohol Use: Never; Drug Use: No History; Caffeine Use: Moderate;  Financial Concerns: No; Food, Clothing or Shelter Needs: No; Support System Lacking: No; Transportation Concerns: No Electronic Signature(s) Signed: 03/30/2023 12:34:47 PM By: Duanne Guess MD FACS Entered By: Duanne Guess on 03/30/2023 09:03:24 [name] ([id number]) [id number] Page 10 of 10 -------------------------------------------------------------------------------- SuperBill Details Patient Name: Date of Service: [name] 03/30/2023 Medical Record Number: [id number] Patient Account Number: [id number] Date of Birth/Sex: Treating RN: June 21, 2005 (16 y.o. [name] Primary Care Provider: Edwina Barth Other Clinician: Referring Provider: Treating Provider/Extender: [name] Weeks in Treatment: 40 Diagnosis Coding ICD-10 Codes Code Description L89.314 Pressure ulcer of right buttock, stage 4 M86.152 Other acute osteomyelitis, left femur Q05.2 Lumbar spina bifida with hydrocephalus K59.2 Neurogenic bowel, not elsewhere classified N31.9 Neuromuscular dysfunction of bladder, unspecified J45.20 Mild intermittent asthma, uncomplicated Facility Procedures : CPT4 Code: [id number] Description: 97605 - WOUND VAC-50 SQ CM OR LESS Modifier: Quantity: 1 Physician Procedures : CPT4 Code Description Modifier [id number] - WC PHYS LEVEL 4 - EST PT ICD-10 Diagnosis Description L89.314 Pressure ulcer of right buttock, stage 4 M86.152 Other acute osteomyelitis, left femur Q05.2 Lumbar spina bifida with hydrocephalus Quantity: 1 Electronic Signature(s) Signed: 03/30/2023 12:05:29 PM By: Duanne Guess MD FACS Entered By: Duanne Guess on 03/30/2023 09:05:28

## 2023-03-31 NOTE — Progress Notes (Signed)
JUMA, GATTUSO ([id number]) [id number] Page 1 of 8 Visit Report for 03/30/2023 Arrival Information Details Patient Name: Date of Service: Perry [place] RIO [name] 03/30/2023 11:15 A M Medical Record Number: [id number] Patient Account Number: [id number] Date of Birth/Sex: Treating RN: 08/15/2005 (17 y.o. M) Primary Care Dakin Madani: Edwina Barth Other Clinician: Referring Tarence Searcy: Treating Sokha Craker/Extender: Eilene Ghazi in Treatment: 53 Visit Information History Since Last Visit Added or deleted any medications: No Patient Arrived: Wheel Chair Any new allergies or adverse reactions: No Arrival Time: 11:12 Had a fall or experienced change in No Accompanied By: grandmother activities of daily living that may affect Transfer Assistance: None risk of falls: Patient Identification Verified: Yes Signs or symptoms of abuse/neglect since last visito No Secondary Verification Process Completed: Yes Hospitalized since last visit: No Patient Requires Transmission-Based Precautions: No Implantable device outside of the clinic excluding No Patient Has Alerts: No cellular tissue based products placed in the center since last visit: Pain Present Now: No Electronic Signature(s) Signed: 03/30/2023 2:56:41 PM By: Dayton Scrape Entered By: Dayton Scrape on 03/30/2023 11:14:02 -------------------------------------------------------------------------------- Encounter Discharge Information Details Patient Name: Date of Service: [name], [name] 03/30/2023 11:15 A M Medical Record Number: [id number] Patient Account Number: [id number] Date of Birth/Sex: Treating RN: [birth year] (17 y.o. [name] Primary Care Rylyn Ranganathan: Edwina Barth Other Clinician: Referring Lonia Roane: Treating Lendell Gallick/Extender: Eilene Ghazi in Treatment: 16 Encounter Discharge Information Items Discharge Condition:  Stable Ambulatory Status: Wheelchair Discharge Destination: Home Transportation: Private Auto Accompanied By: grandmother Schedule Follow-up Appointment: Yes Clinical Summary of Care: Patient Declined Electronic Signature(s) Signed: 03/30/2023 5:10:59 PM By: Zenaida Deed RN, BSN Entered By: Zenaida Deed on 03/30/2023 12:01:27 [name] ([id number]) [id number] Page 2 of 8 -------------------------------------------------------------------------------- Lower Extremity Assessment Details Patient Name: Date of Service: Sheryn Bison 03/30/2023 11:15 A M Medical Record Number: [id number] Patient Account Number: [id number] Date of Birth/Sex: Treating RN: 04/20/2006 (17 y.o. [name] Primary Care Milburn Freeney: Edwina Barth Other Clinician: Referring Sheralee Qazi: Treating Cristen Bredeson/Extender: [name] Weeks in Treatment: 72 Electronic Signature(s) Signed: 03/30/2023 5:10:59 PM By: Zenaida Deed RN, BSN Entered By: Zenaida Deed on 03/30/2023 11:25:10 -------------------------------------------------------------------------------- Multi Wound Chart Details Patient Name: Date of Service: [name], [name] 03/30/2023 11:15 A M Medical Record Number: [id number] Patient Account Number: [id number] Date of Birth/Sex: Treating RN: [birth year] (17 y.o. M) Primary Care Katori Wirsing: Edwina Barth Other Clinician: Referring Isao Seltzer: Treating Davianna Deutschman/Extender: [name] Weeks in Treatment: 66 Vital Signs Height(in): 60 Pulse(bpm): 72 Weight(lbs): 110 Blood Pressure(mmHg): 107/69 Body Mass Index(BMI): 21.5 Temperature(F): 99.3 Respiratory Rate(breaths/min): 20 [1:Photos:] [N/A:N/A] Right Ischium N/A N/A Wound Location: Pressure Injury N/A N/A Wounding Event: Pressure Ulcer N/A N/A Primary Etiology: Asthma, Paraplegia N/A N/A Comorbid History: 05/11/2022 N/A N/A Date  Acquired: 18 N/A N/A Weeks of Treatment: Open N/A N/A Wound Status: No N/A N/A Wound Recurrence: 2.6x3.8x1 N/A N/A Measurements L x W x D (cm) 7.76 N/A N/A A (cm) : rea 7.76 N/A N/A Volume (cm) : -20.60% N/A N/A % Reduction in A rea: -141.30% N/A N/A % Reduction in Volume: 12 Position 1 (o'clock): 4.6 Maximum Distance 1 (cm): 12 Starting Position 1 (o'clock): 6 Ending Position 1 (o'clock): 2.1 Maximum Distance 1 (cm): Yes N/A N/A Tunneling: Yes N/A N/A Undermining: Category/Stage IV N/A N/A Classification: Medium N/A N/A Exudate A mountAWAIS, MANZA ([id number]) [id number] Page 3 of 8 Serosanguineous N/A N/A  Exudate Type: red, brown N/A N/A Exudate Color: Well defined, not attached N/A N/A Wound Margin: Large (67-100%) N/A N/A Granulation Amount: Red, Pink, Pale N/A N/A Granulation Quality: Small (1-33%) N/A N/A Necrotic Amount: Fat Layer (Subcutaneous Tissue): Yes N/A N/A Exposed Structures: Muscle: Yes Fascia: No Tendon: No Joint: No Bone: No None N/A N/A Epithelialization: No Abnormalities Noted N/A N/A Periwound Skin Texture: No Abnormalities Noted N/A N/A Periwound Skin Moisture: No Abnormalities Noted N/A N/A Periwound Skin Color: No Abnormality N/A N/A Temperature: Negative Pressure Wound Therapy N/A N/A Procedures Performed: Maintenance (NPWT) Treatment Notes Electronic Signature(s) Signed: 03/30/2023 12:00:11 PM By: Duanne Guess MD FACS Entered By: Duanne Guess on 03/30/2023 12:00:11 -------------------------------------------------------------------------------- Multi-Disciplinary Care Plan Details Patient Name: Date of Service: [name], [name] 03/30/2023 11:15 A M Medical Record Number: [id number] Patient Account Number: [id number] Date of Birth/Sex: Treating RN: 12-24-2005 (17 y.o. [name] Primary Care Linkyn Gobin: Edwina Barth Other Clinician: Referring  Ayeisha Lindenberger: Treating Ashlynd Michna/Extender: Eilene Ghazi in Treatment: 27 Multidisciplinary Care Plan reviewed with physician Active Inactive Pressure Nursing Diagnoses: Knowledge deficit related to causes and risk factors for pressure ulcer development Knowledge deficit related to management of pressures ulcers Potential for impaired tissue integrity related to pressure, friction, moisture, and shear Goals: Patient/caregiver will verbalize understanding of pressure ulcer management Date Initiated: 06/14/2022 Target Resolution Date: 04/26/2023 Goal Status: Active Interventions: Assess: immobility, friction, shearing, incontinence upon admission and as needed Assess offloading mechanisms upon admission and as needed Assess potential for pressure ulcer upon admission and as needed Treatment Activities: Patient referred for seating evaluation to ensure proper offloading : 06/14/2022 Pressure reduction/relief device ordered : 06/14/2022 Notes: Wound/Skin Impairment Nursing Diagnoses: Impaired tissue integrity Knowledge deficit related to ulceration/compromised skin integrity CAIDAN, HOGANCAMP ([id number]) [id number] Page 4 of 8 Goals: Patient/caregiver will verbalize understanding of skin care regimen Date Initiated: 06/14/2022 Target Resolution Date: 04/26/2023 Goal Status: Active Ulcer/skin breakdown will have a volume reduction of 30% by week 4 Date Initiated: 06/14/2022 Date Inactivated: 07/15/2022 Target Resolution Date: 09/14/2022 Unmet Reason: requires new w/c Goal Status: Unmet cushion Ulcer/skin breakdown will have a volume reduction of 50% by week 8 Date Initiated: 07/15/2022 Date Inactivated: 09/16/2022 Target Resolution Date: 08/12/2022 Goal Status: Unmet Unmet Reason: infection Interventions: Assess patient/caregiver ability to obtain necessary supplies Assess patient/caregiver ability to perform ulcer/skin care regimen  upon admission and as needed Assess ulceration(s) every visit Provide education on ulcer and skin care Treatment Activities: Skin care regimen initiated : 06/14/2022 Topical wound management initiated : 06/14/2022 Notes: Electronic Signature(s) Signed: 03/30/2023 5:10:59 PM By: Zenaida Deed RN, BSN Entered By: Zenaida Deed on 03/30/2023 11:30:08 -------------------------------------------------------------------------------- Negative Pressure Wound Therapy Maintenance (NPWT) Details Patient Name: Date of Service: [name] 03/30/2023 11:15 A M Medical Record Number: [id number] Patient Account Number: [id number] Date of Birth/Sex: Treating RN: 01/23/2006 (16 y.o. [name] Primary Care Feiga Nadel: Edwina Barth Other Clinician: Referring Ashiah Karpowicz: Treating Khaza Blansett/Extender: [name] Weeks in Treatment: 41 NPWT Maintenance Performed for: Wound #1 Right Ischium Performed By: Zenaida Deed, RN Type: Medela [employer] Coverage Size (sq cm): 9.88 Pressure Type: Constant Pressure Setting: 125 mmHG Drain Type: None Primary Contact: Other : silver collagen Sponge/Dressing Type: Foam- Black Date Initiated: 08/26/2022 Dressing Removed: Yes Quantity of Sponges/Gauze Removed: 1 Canister Changed: No Canister Exudate Volume: 50 Dressing Reapplied: Yes Quantity of Sponges/Gauze Inserted: 1 Respones T Treatment: o good Days On NPWT : 217 Post Procedure Diagnosis Same as Pre-procedure  Electronic Signature(s) Signed: 03/30/2023 5:10:59 PM By: Zenaida Deed RN, BSN Entered By: Zenaida Deed on 03/30/2023 11:37:06 [name] ([id number]) [id number] Page 5 of 8 -------------------------------------------------------------------------------- Pain Assessment Details Patient Name: Date of Service: Sheryn Bison. 03/30/2023 11:15 A M Medical Record Number: [id number] Patient Account Number:  [id number] Date of Birth/Sex: Treating RN: Apr 07, 2006 (16 y.o. M) Primary Care Aikeem Lilley: Edwina Barth Other Clinician: Referring Nevaeh Casillas: Treating Rosene Pilling/Extender: [name] Weeks in Treatment: 94 Active Problems Location of Pain Severity and Description of Pain Patient Has Paino No Site Locations Pain Management and Medication Current Pain Management: Electronic Signature(s) Signed: 03/30/2023 2:56:41 PM By: Dayton Scrape Entered By: Dayton Scrape on 03/30/2023 11:14:34 -------------------------------------------------------------------------------- Patient/Caregiver Education Details Patient Name: Date of Service: Perry Mount RIO [name] 11/13/2024andnbsp11:15 A M Medical Record Number: [id number] Patient Account Number: [id number] Date of Birth/Gender: Treating RN: 01-20-2006 (17 y.o. [name] Primary Care Physician: Edwina Barth Other Clinician: Referring Physician: Treating Physician/Extender: Eilene Ghazi in Treatment: 36 Education Assessment Education Provided To: Patient and Caregiver Education Topics Provided Pressure: Methods: Explain/Verbal Responses: Reinforcements needed, State content correctly FRUTOSO, MUSSELMAN ([id number]) [id number] Page 6 of 8 Wound/Skin Impairment: Methods: Explain/Verbal Responses: Reinforcements needed, State content correctly Electronic Signature(s) Signed: 03/30/2023 5:10:59 PM By: Zenaida Deed RN, BSN Entered By: Zenaida Deed on 03/30/2023 11:32:02 -------------------------------------------------------------------------------- Wound Assessment Details Patient Name: Date of Service: [name], [name] 03/30/2023 11:15 A M Medical Record Number: [id number] Patient Account Number: [id number] Date of Birth/Sex: Treating RN: Jun 26, 2005 (16 y.o. [name] Primary Care Kerith Sherley: Edwina Barth Other Clinician: Referring  Whitley Strycharz: Treating Makeda Peeks/Extender: [name] Weeks in Treatment: 16 Wound Status Wound Number: 1 Primary Etiology: Pressure Ulcer Wound Location: Right Ischium Wound Status: Open Wounding Event: Pressure Injury Comorbid History: Asthma, Paraplegia Date Acquired: 05/11/2022 Weeks Of Treatment: 41 Clustered Wound: No Photos Wound Measurements Length: (cm) 2.6 Width: (cm) 3.8 Depth: (cm) 1 Area: (cm) 7.76 Volume: (cm) 7.76 % Reduction in Area: -20.6% % Reduction in Volume: -141.3% Epithelialization: None Tunneling: Yes Position (o'clock): 12 Maximum Distance: (cm) 4.6 Undermining: Yes Starting Position (o'clock): 12 Ending Position (o'clock): 6 Maximum Distance: (cm) 2.1 Wound Description Classification: Category/Stage IV Wound Margin: Well defined, not attached Exudate Amount: Medium Exudate Type: Serosanguineous Exudate Color: red, brown Foul Odor After Cleansing: No Slough/Fibrino No Wound Bed Granulation Amount: Large (67-100%) Exposed Structure Granulation Quality: Red, Pink, Pale Fascia Exposed: No Necrotic Amount: Small (1-33%) Fat Layer (Subcutaneous Tissue) Exposed: COLLEEN, LAMUNYON ([id number]) [id number] Page 7 of 8 Tendon Exposed: No Muscle Exposed: Yes Necrosis of Muscle: Joint Exposed: No Bone Exposed: No Periwound Skin Texture Texture Color No Abnormalities Noted: Yes No Abnormalities Noted: Yes Moisture Temperature / Pain No Abnormalities Noted: Yes Temperature: No Abnormality Treatment Notes Wound #1 (Ischium) Wound Laterality: Right Cleanser Soap and Water Discharge Instruction: May shower and wash wound with dial antibacterial soap and water prior to dressing change. Vashe 5.8 (oz) Discharge Instruction: Cleanse the wound with Vashe prior to applying a clean dressing using gauze sponges, not tissue or cotton balls. Peri-Wound Care Skin Prep Discharge Instruction: Use skin prep  as directed Topical Primary Dressing Promogran Prisma Matrix, 4.34 (sq in) (silver collagen) Discharge Instruction: Moisten collagen with saline or hydrogel NPWT Secondary Dressing Secured With Compression Wrap Compression Stockings Add-Ons Electronic Signature(s) Signed: 03/30/2023 5:10:59 PM By: Zenaida Deed RN, BSN Entered By: Zenaida Deed on 03/30/2023 11:29:19 -------------------------------------------------------------------------------- Vitals Details  Patient Name: Date of Service: Sheryn Bison 03/30/2023 11:15 A M Medical Record Number: [id number] Patient Account Number: [id number] Date of Birth/Sex: Treating RN: Mar 09, 2006 (16 y.o. M) Primary Care Loreen Bankson: Edwina Barth Other Clinician: Referring Latice Waitman: Treating Yanira Tolsma/Extender: Eilene Ghazi in Treatment: 27 Vital Signs Time Taken: 11:14 Temperature (F): 99.3 Height (in): 60 Pulse (bpm): 72 Weight (lbs): 110 Respiratory Rate (breaths/min): 20 Body Mass Index (BMI): 21.5 Blood Pressure (mmHg): 107/69 Reference Range: 80 - 120 mg / dl Electronic Signature(s) Signed: 03/30/2023 2:56:41 PM By: Marshell Garfinkel, [name] ([id number]) [id number] Page 8 of 8 Entered By: Dayton Scrape on 03/30/2023 11:14:24

## 2023-04-12 ENCOUNTER — Encounter (HOSPITAL_BASED_OUTPATIENT_CLINIC_OR_DEPARTMENT_OTHER): Payer: Medicaid Other | Admitting: General Surgery

## 2023-04-12 DIAGNOSIS — L89314 Pressure ulcer of right buttock, stage 4: Secondary | ICD-10-CM | POA: Diagnosis not present

## 2023-04-12 NOTE — Progress Notes (Signed)
QUENTAVIOUS, ZOMBEK ([id number]) [id number] Page 1 of 10 Visit Report for 04/12/2023 Chief Complaint Document Details Patient Name: Date of Service: [name] 04/12/2023 11:15 A M Medical Record Number: [id number] Patient Account Number: [id number] Date of Birth/Sex: Treating RN: 11/12/2005 (17 y.o. M) Primary Care Provider: Edwina Barth Other Clinician: Referring Provider: Treating Provider/Extender: Eilene Ghazi in Treatment: 63 Information Obtained from: Patient Chief Complaint Patient is at the clinic for treatment of an open pressure ulcer Electronic Signature(s) Signed: 04/12/2023 12:31:13 PM By: Duanne Guess MD FACS Entered By: Duanne Guess on 04/12/2023 12:31:13 -------------------------------------------------------------------------------- HPI Details Patient Name: Date of Service: [name], Cory RIO N L. 04/12/2023 11:15 A M Medical Record Number: [id number] Patient Account Number: [id number] Date of Birth/Sex: Treating RN: 2005-11-22 (17 y.o. 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 Around Christmas time, he developed a pressure ulcer on his right ischium. This seems to be related to the cushioning in his wheelchair. They have an appointment coming up on Wednesday to have this checked and addressed. For some reason he has been prescribed doxycycline and Flagyl and has a couple days left of this. They have just been covering the site with gauze. On his right ischial area, he has an oval wound that extends into the fat layer. There is some slough and fibrinous exudate present on the surface. There  is no erythema, induration, malodor, or purulent drainage to suggest infection. 06/21/2022: The wound measured larger and deeper today. There is evidence of pressure induced tissue injury and the surface is a bit dry with fibrinous exudate accumulation. He does not yet have a new cushion for his wheelchair. 06/30/2022: His wound is deeper again. The surface is very clean. They did obtain the eggcrate cushion, but did not cut out an area to offload his ulcer. 07/07/2022: His wound measured a little bit smaller today. There is slough on the wound surface. For some reason they did not get the Iodosorb gel and have been using Iodosorb pads; I do not think these are doing as good a job of chemical debridement as the gel would. They did cut out the space in his eggcrate foam cushion to accommodate his wound and I think this is helpful. 07/15/2022: His wound is deeper today. The tissue is pale, but the wound is fairly clean. For reasons that remain unclear, his wheelchair cushion has not been replaced and his caregivers have not contacted [employer] to do anything about it. 07/20/2022: His wound is a little bit smaller. There is still a lot of undermining. He has more slough accumulation today. We ordered him a Roho cushion last week, but he has not yet received it. 3/13; patient presents for follow-up. He has been using Hydrofera Blue to the right ischium wound bed. He has no issues or complaints today. 08/05/2022: The color of the tissue in the wound bed has improved markedly. It is much more beefy and red, whereas previously it has been quite pale. There is some slough on the wound surface. He finally got his custom molded wheelchair cushion for his school chair and a Roho cushion for home. There was an error on the part of the DME company for his low-air-loss mattress bed, but this is  in the process of being resolved. [name], [name] ([id number]) [phone].pdf Page 2 of 10 08/26/2022:  The wound is measuring a little deeper; it looks like some fat simply separated in the center of his wound, rather than worsening of the pressure injury. The quality of the tissue continues to improve. His low-air-loss mattress was finally delivered. He has his wound VAC with him for application today. 09/02/2022: The wound continues to worsen. I can palpate his trochanter in the deepest part of the wound, although the bone is not exposed. There is a strong odor coming from the wound and the tissue surface is gray. 09/09/2022: The progression of the wound seems to have been arrested. The color is markedly improved and is now beefy red. There are still some areas of the tissue that are more purpleish, suggesting ongoing pressure induced injury. The odor has abated. The culture that I took produced a polymicrobial population including Proteus, Pseudomonas, and multiple other species. He is currently taking levofloxacin and metronidazole. 09/16/2022: The wound continues to improve. The color and is red and the surface is appropriately moist. He has been in his Roho cushion and I do not see any areas of further pressure induced injury. He is completing his course of oral levofloxacin and Flagyl. 09/23/2022: No pressure induced tissue damage this week. There is no odor coming from the wound. No significant changes to the wound dimensions, however. 09/29/2022: The wound size remains about the same. The tissues are looking a bit healthier. There is slough on the wound surface. 10/07/2022: The wound is looking better. The undermining at 12:00 has closed down. The tunneling that extends over the trochanter is still present, but is tighter and narrower. The tissues look healthier. There is an odor, however, on the dressing. 10/14/2022: There has been very nice improvement in the wound over the past week. The tunneling of the trochanter is shorter by a good half centimeter and the tissue is much tighter. Near 12:00 the  wound is nearly flush with the surrounding skin surface. The odor has resolved. 10/25/2022: He had his wound VAC off for graduation and there has been some breakdown over the trochanter. The bone is not yet exposed, but the tissue is thinner. There is a layer of slough on the wound surface. 11/03/2022: There has been nice improvement since our last visit. The tissue over the trochanter has filled again and the space is contracting. The granulation tissue appears healthy and there is no slough. No evidence of ongoing pressure-induced tissue injury. 11/09/2022: The wound continues to improve. The undermined area is filling in more, but the tissue over the trochanter is still quite thin. There is minimal biofilm on the surface and no evidence of pressure induced tissue injury today. 11/16/2022: The undermined portion of the wound has filled in even more and the tissue over the trochanter is starting to get a bit thicker. Everything is extremely clean today without any odor or slough accumulation. 11/22/2022: The undermined area of the wound continues to contract. The wound is very clean. The tissue is robust and beefy red. 12/14/2022: The undermined area has come in by almost a cm. The tissue over the trochanter feels thicker. The wound is clean without any odor. 01/03/2023: The wound is stable, but there is some purpleish discoloration directly over the ischium consistent with pressure in this area. He has a little bit of slough accumulation around the edges of the wound. There is also an odor to the wound, despite use of  topical gentamicin and mupirocin. 01/19/2023: The wound has filled in and the cavity is not as deep. The area overlying the trochanter is closing in. I do not appreciate any pressure-related tissue injury. There is some slough on the surface. The odor has abated. He is currently still taking the Augmentin I prescribed in response to his culture data. The x- ray that was done was concerning for  possible osteomyelitis and he has had an MRI, but this has not yet been read. His albumin is normal. The referral to plastic surgery is on hold while we complete this testing and I suspect he will require treatment for osteomyelitis before they will agree to see him. 02/03/2023: The wound continues to contract. No pressure-related tissue injury. No odor. He saw infectious disease yesterday for osteomyelitis and will receive Dalvance infusions on a weekly basis. He does have an appointment to see plastics at [facility] coming up on the 26th of this month. 02/16/2023: There is senescent skin that has built up around the wound edges and some biofilm on the surface. The area overlying the trochanter has gotten more closed in and tighter. The external wound dimensions are about the same. He has completed his Dalvance treatment. When he saw plastic surgery last week, they recommended pressure mapping and intervention with different offloading including a new seat cushion, a reclining wheelchair to help a lot of the ischium and to decrease the amount of time he spends in his wheelchair. He was advised to spend most of his time lying in bed. He was advised to offload the area completely for 3 months and allow the [facility] more time to work. 03/01/2023: The wound is extremely clean today and is getting shallower with less undermining. The area over the trochanter is quite a bit tighter. There is no evidence of pressure induced tissue injury. 03/16/2023: I can no longer get a finger over the top of his trochanter. The tissue remains pink and viable although it does tend towards the dry side, slightly. 03/30/2023: The undermined area continues to contract, but there has been deterioration at the center of the wound directly overlying the ischium. Bone is not frankly exposed yet, but I am concerned this may occur if there is further deterioration. There is also a small tunnel that seems to be just separation of the muscle  layers at about the 12 o'clock position of the wound. It is difficult to find with a skinny probe. There is no malodor or purulent drainage. 04/12/2023: Fortunately, the wound seems to have rebounded from the last visit. Bone is better covered at the ischial site. The tunnel that was identified last week could not be appreciated. The tissue has closed and even more securely over the top of his trochanter. No malodor or purulent drainage. Electronic Signature(s) Signed: 04/12/2023 12:32:20 PM By: Duanne Guess MD FACS Entered By: Duanne Guess on 04/12/2023 12:32:19 -------------------------------------------------------------------------------- Physical Exam Details Patient Name: Date of Service: Cory Mount RIO N L. 04/12/2023 11:15 A M Medical Record Number: [id number] Patient Account Number: [id number] Date of Birth/Sex: Treating RN: 04-11-2006 (16 y.o. M) Primary Care Provider: Edwina Barth Other Clinician: Referring Provider: Treating Provider/Extender: [name] [place], [place] ([id number]) [id number] Page 3 of 10 Weeks in Treatment: 43 Constitutional . . . . no acute distress. Respiratory Normal work of breathing on room air.. Notes 04/12/2023: Fortunately, the wound seems to have rebounded from the last visit. Bone is better covered at the ischial site. The tunnel that was  identified last week could not be appreciated. The tissue has closed and even more securely over the top of his trochanter. No malodor or purulent drainage. Electronic Signature(s) Signed: 04/12/2023 12:32:59 PM By: Duanne Guess MD FACS Entered By: Duanne Guess on 04/12/2023 12:32:59 -------------------------------------------------------------------------------- Physician Orders Details Patient Name: Date of Service: [name], Cory RIO N L. 04/12/2023 11:15 A M Medical Record Number: [id number] Patient Account Number: [id number] Date  of Birth/Sex: Treating RN: 27-Feb-2006 (16 y.o. 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 - Dr. Lady Gary RM 1 Return A Anesthetic Wound #1 Right Ischium (In clinic) Topical Lidocaine 4% applied to wound bed Bathing/ Shower/ Hygiene May shower and wash wound with soap and water. Negative Presssure Wound Therapy Medela Wound Vac continuously at 172mm/hg Black Foam Off-Loading Low air-loss mattress (Group 2) - ordered through [employer] Roho cushion for wheelchair - sent referral to [employer] Turn and reposition every 2 hours - use arms to lift up off the wheelchair at least hourly while up in wheelchair Additional Orders / Instructions Follow Nutritious Diet - 70- 100 gms of protein per day. add protein drink such as Premeir Protein 2 times per day. Use the Juven for added protein intake. Wound Treatment Wound #1 - Ischium Wound Laterality: Right Cleanser: Soap and Water 3 x Per Week/15 Days [name], [name] ([id number]) [phone].pdf Page 4 of 10 Discharge Instructions: May shower and wash wound with dial antibacterial soap and water prior to dressing change. Cleanser: Vashe 5.8 (oz) 3 x Per Week/15 Days Discharge Instructions: Cleanse the wound with Vashe prior to applying a clean dressing using  gauze sponges, not tissue or cotton balls. Peri-Wound Care: Skin Prep 3 x Per Week/15 Days Discharge Instructions: Use skin prep as directed Prim Dressing: Promogran Prisma Matrix, 4.34 (sq in) (silver collagen) 3 x Per Week/15 Days ary Discharge Instructions: Moisten collagen with saline or hydrogel Prim Dressing: NPWT ary 3 x Per Week/15 Days Patient Medications llergies: latex A Notifications Medication Indication Start End 04/12/2023 lidocaine DOSE topical 4 % cream - cream topical Electronic Signature(s) Signed: 04/12/2023 1:54:30 PM By: Duanne Guess MD FACS Entered By: Duanne Guess on 04/12/2023 12:33:18 Prescription 04/12/2023 -------------------------------------------------------------------------------- [name] Duanne Guess MD Patient Name: Provider: 02/15/2006 [id number] Date of Birth: NPI#: Judie Petit [id number] Sex: DEA #: [phone] Phone #: License #: UPN: Patient Address: [address] [facility] [place], [place] [address] [address] [place], [place] [address] [phone] Allergies latex Provider's Orders Roho cushion for wheelchair - sent referral to [employer] Hand Signature: Date(s): Electronic Signature(s) Signed: 04/12/2023 1:54:30 PM By: Duanne Guess MD FACS Entered By: Duanne Guess on 04/12/2023 12:33:18 Problem List Details -------------------------------------------------------------------------------- [name] ([id number]) [phone].pdf Page 5 of 10 Patient Name: Date of Service: [name] 04/12/2023 11:15 A M Medical Record Number: [id number] Patient Account Number: [id number] Date of Birth/Sex: Treating RN: 2005-11-20 (16 y.o. 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 04/12/2023 11:15 A M Medical Record Number: [id number] Patient Account Number: [id number] Date of Birth/Sex: Treating RN: [birth year] (16 y.o. 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 Around Christmas time, he developed a pressure ulcer on his right ischium. This seems to be related to the cushioning in his wheelchair. They have an appointment coming up on Wednesday to have this checked and addressed. For some reason he has been prescribed doxycycline and Flagyl and has a couple days left of this. They have just been covering the site with gauze. [name], [name] ([id number]) [phone].pdf Page 6 of 10 On his right ischial area, he has an oval wound that extends into  the fat layer. There is some slough and fibrinous exudate present on the surface. There is no erythema, induration, malodor, or purulent drainage to suggest infection. 06/21/2022: The wound measured larger and deeper today. There is evidence of pressure induced tissue injury and the surface is a bit dry with fibrinous exudate accumulation. He does not yet have a new cushion for his wheelchair. 06/30/2022: His wound is deeper again. The surface is very clean. They did obtain the eggcrate cushion, but did not cut out an area to offload his ulcer. 07/07/2022: His wound measured a little bit smaller today. There is slough on the wound surface. For some reason they did not get the Iodosorb gel and have been using Iodosorb pads; I do not think these are doing as good a job of chemical debridement as the gel would. They did cut out the space in his eggcrate foam cushion to accommodate his wound and I think this is helpful. 07/15/2022: His wound is deeper today. The tissue is pale, but the wound is fairly clean. For reasons that remain unclear, his wheelchair cushion has not been replaced and his caregivers have not contacted [employer] to do anything about it. 07/20/2022: His wound is a little bit smaller. There is still a lot of undermining. He has more slough accumulation today. We ordered him a Roho cushion last week, but he has not yet received it. 3/13; patient presents for follow-up. He has been using Hydrofera Blue to the right ischium wound bed. He has no issues or complaints today. 08/05/2022: The color of the tissue in the wound bed has improved markedly. It is much more beefy and red, whereas previously it has been quite pale. There is some slough on the wound surface. He finally got his custom molded wheelchair cushion for his school chair and a Roho cushion  for home. There was an error on the part of the DME company for his low-air-loss mattress bed, but this is in the process of being  resolved. 08/26/2022: The wound is measuring a little deeper; it looks like some fat simply separated in the center of his wound, rather than worsening of the pressure injury. The quality of the tissue continues to improve. His low-air-loss mattress was finally delivered. He has his wound VAC with him for application today. 09/02/2022: The wound continues to worsen. I can palpate his trochanter in the deepest part of the wound, although the bone is not exposed. There is a strong odor coming from the wound and the tissue surface is gray. 09/09/2022: The progression of the wound seems to have been arrested. The color is markedly improved and is now beefy red. There are still some areas of the tissue that are more purpleish, suggesting ongoing pressure induced injury. The odor has abated. The culture that I took produced a polymicrobial population including Proteus, Pseudomonas, and multiple other species. He is currently taking levofloxacin and metronidazole. 09/16/2022: The wound continues to improve. The color and is red and the surface is appropriately moist. He has been in his Roho cushion and I do not see any areas of further pressure induced injury. He is completing his course of oral levofloxacin and Flagyl. 09/23/2022: No pressure induced tissue damage this week. There is no odor coming from the wound. No significant changes to the wound dimensions, however. 09/29/2022: The wound size remains about the same. The tissues are looking a bit healthier. There is slough on the wound surface. 10/07/2022: The wound is looking better. The undermining at 12:00 has closed down. The tunneling that extends over the trochanter is still present, but is tighter and narrower. The tissues look healthier. There is an odor, however, on the dressing. 10/14/2022: There has been very nice improvement in the wound over the past week. The tunneling of the trochanter is shorter by a good half centimeter and the tissue is much  tighter. Near 12:00 the wound is nearly flush with the surrounding skin surface. The odor has resolved. 10/25/2022: He had his wound VAC off for graduation and there has been some breakdown over the trochanter. The bone is not yet exposed, but the tissue is thinner. There is a layer of slough on the wound surface. 11/03/2022: There has been nice improvement since our last visit. The tissue over the trochanter has filled again and the space is contracting. The granulation tissue appears healthy and there is no slough. No evidence of ongoing pressure-induced tissue injury. 11/09/2022: The wound continues to improve. The undermined area is filling in more, but the tissue over the trochanter is still quite thin. There is minimal biofilm on the surface and no evidence of pressure induced tissue injury today. 11/16/2022: The undermined portion of the wound has filled in even more and the tissue over the trochanter is starting to get a bit thicker. Everything is extremely clean today without any odor or slough accumulation. 11/22/2022: The undermined area of the wound continues to contract. The wound is very clean. The tissue is robust and beefy red. 12/14/2022: The undermined area has come in by almost a cm. The tissue over the trochanter feels thicker. The wound is clean without any odor. 01/03/2023: The wound is stable, but there is some purpleish discoloration directly over the ischium consistent with pressure in this area. He has a little bit of slough accumulation around the edges of the  wound. There is also an odor to the wound, despite use of topical gentamicin and mupirocin. 01/19/2023: The wound has filled in and the cavity is not as deep. The area overlying the trochanter is closing in. I do not appreciate any pressure-related tissue injury. There is some slough on the surface. The odor has abated. He is currently still taking the Augmentin I prescribed in response to his culture data. The x- ray that was  done was concerning for possible osteomyelitis and he has had an MRI, but this has not yet been read. His albumin is normal. The referral to plastic surgery is on hold while we complete this testing and I suspect he will require treatment for osteomyelitis before they will agree to see him. 02/03/2023: The wound continues to contract. No pressure-related tissue injury. No odor. He saw infectious disease yesterday for osteomyelitis and will receive Dalvance infusions on a weekly basis. He does have an appointment to see plastics at [facility] coming up on the 26th of this month. 02/16/2023: There is senescent skin that has built up around the wound edges and some biofilm on the surface. The area overlying the trochanter has gotten more closed in and tighter. The external wound dimensions are about the same. He has completed his Dalvance treatment. When he saw plastic surgery last week, they recommended pressure mapping and intervention with different offloading including a new seat cushion, a reclining wheelchair to help a lot of the ischium and to decrease the amount of time he spends in his wheelchair. He was advised to spend most of his time lying in bed. He was advised to offload the area completely for 3 months and allow the [facility] more time to work. 03/01/2023: The wound is extremely clean today and is getting shallower with less undermining. The area over the trochanter is quite a bit tighter. There is no evidence of pressure induced tissue injury. 03/16/2023: I can no longer get a finger over the top of his trochanter. The tissue remains pink and viable although it does tend towards the dry side, slightly. 03/30/2023: The undermined area continues to contract, but there has been deterioration at the center of the wound directly overlying the ischium. Bone is not frankly exposed yet, but I am concerned this may occur if there is further deterioration. There is also a small tunnel that seems to be just  separation of the muscle layers at about the 12 o'clock position of the wound. It is difficult to find with a skinny probe. There is no malodor or purulent drainage. 04/12/2023: Fortunately, the wound seems to have rebounded from the last visit. Bone is better covered at the ischial site. The tunnel that was identified last week could not be appreciated. The tissue has closed and even more securely over the top of his trochanter. No malodor or purulent drainage. [name], [name] ([id number]) [phone].pdf Page 7 of 10 Patient History Information obtained from Caregiver, Chart. Family History Kidney Disease - Father, No family history of Cancer, Diabetes, Heart Disease, Hereditary Spherocytosis, Hypertension, Lung Disease, Seizures, Stroke, Thyroid Problems, Tuberculosis. Social History Never smoker, Marital Status - Single, Alcohol Use - Never, Drug Use - No History, Caffeine Use - Moderate. Medical History Eyes Denies history of Cataracts, Glaucoma, Optic Neuritis Ear/Nose/Mouth/Throat Denies history of Chronic sinus problems/congestion, Middle ear problems Respiratory Patient has history of Asthma - mild Endocrine Denies history of Type I Diabetes, Type II Diabetes Genitourinary Denies history of End Stage Renal Disease Integumentary (Skin) Denies history of  History of Burn Neurologic Patient has history of Paraplegia Oncologic Denies history of Received Chemotherapy, Received Radiation Psychiatric Denies history of Anorexia/bulimia, Confinement Anxiety Hospitalization/Surgery History - myringotomy. - hip surgery. - ventriculoperitoneal shunt. Medical A Surgical History Notes nd Ear/Nose/Mouth/Throat allergic rhinitis Gastrointestinal ostomy, bowel program Genitourinary neurogenic bladder Integumentary (Skin) eczema Musculoskeletal contracture of left hip, spinabifida Neurologic developmental delay, loloprosencephaly,  hydrocephalus Objective Constitutional no acute distress. Vitals Time Taken: 11:32 AM, Height: 60 in, Weight: 110 lbs, BMI: 21.5, Temperature: 97.7 F, Pulse: 99 bpm, Respiratory Rate: 16 breaths/min, Blood Pressure: 111/72 mmHg. Respiratory Normal work of breathing on room air.. General Notes: 04/12/2023: Fortunately, the wound seems to have rebounded from the last visit. Bone is better covered at the ischial site. The tunnel that was identified last week could not be appreciated. The tissue has closed and even more securely over the top of his trochanter. No malodor or purulent drainage. Integumentary (Hair, Skin) Wound #1 status is Open. Original cause of wound was Pressure Injury. The date acquired was: 05/11/2022. The wound has been in treatment 43 weeks. The wound is located on the Right Ischium. The wound measures 3cm length x 3.2cm width x 1.5cm depth; 7.54cm^2 area and 11.31cm^3 volume. There is muscle and Fat Layer (Subcutaneous Tissue) exposed. There is no tunneling noted, however, there is undermining starting at 12:00 and ending at 12:00 with a maximum distance of 1.3cm. There is a medium amount of serosanguineous drainage noted. The wound margin is well defined and not attached to the wound base. There is large (67-100%) red, pink, pale granulation within the wound bed. There is a small (1-33%) amount of necrotic tissue within the wound bed including Adherent Slough. The periwound skin appearance had no abnormalities noted for texture. The periwound skin appearance had no abnormalities noted for moisture. The periwound skin appearance had no abnormalities noted for color. Periwound temperature was noted as No Abnormality. Assessment [name], [name] ([id number]) [phone].pdf Page 8 of 10 Active Problems ICD-10 Pressure ulcer of right buttock, stage 4 Other acute osteomyelitis, left femur Lumbar spina bifida with hydrocephalus Neurogenic bowel, not  elsewhere classified Neuromuscular dysfunction of bladder, unspecified Mild intermittent asthma, uncomplicated Plan Follow-up Appointments: Return Appointment in 2 weeks. - Dr. Lady Gary RM 1 Anesthetic: Wound #1 Right Ischium: (In clinic) Topical Lidocaine 4% applied to wound bed Bathing/ Shower/ Hygiene: May shower and wash wound with soap and water. Negative Presssure Wound Therapy: Medela Wound Vac continuously at 148mm/hg Black Foam Off-Loading: Low air-loss mattress (Group 2) - ordered through [employer] Roho cushion for wheelchair - sent referral to [employer] Turn and reposition every 2 hours - use arms to lift up off the wheelchair at least hourly while up in wheelchair Additional Orders / Instructions: Follow Nutritious Diet - 70- 100 gms of protein per day. add protein drink such as Premeir Protein 2 times per day. Use the Juven for added protein intake. The following medication(s) was prescribed: lidocaine topical 4 % cream cream topical was prescribed at facility WOUND #1: - Ischium Wound Laterality: Right Cleanser: Soap and Water 3 x Per Week/15 Days Discharge Instructions: May shower and wash wound with dial antibacterial soap and water prior to dressing change. Cleanser: Vashe 5.8 (oz) 3 x Per Week/15 Days Discharge Instructions: Cleanse the wound with Vashe prior to applying a clean dressing using gauze sponges, not tissue or cotton balls. Peri-Wound Care: Skin Prep 3 x Per Week/15 Days Discharge Instructions: Use skin prep as directed Prim Dressing: Promogran Prisma Matrix, 4.34 (sq  in) (silver collagen) 3 x Per Week/15 Days ary Discharge Instructions: Moisten collagen with saline or hydrogel Prim Dressing: NPWT 3 x Per Week/15 Days ary 04/12/2023: Fortunately, the wound seems to have rebounded from the last visit. Bone is better covered at the ischial site. The tunnel that was identified last week could not be appreciated. The tissue has closed and even more securely  over the top of his trochanter. No malodor or purulent drainage. No debridement was necessary today. Will continue to apply Prisma silver collagen to the wound bed and continue negative pressure wound therapy. Continue aggressive offloading efforts and maintain adequate protein intake. Follow-up in 2 to 3 weeks, pending clinic and provider availability. Electronic Signature(s) Signed: 04/12/2023 12:33:57 PM By: Duanne Guess MD FACS Entered By: Duanne Guess on 04/12/2023 12:33:56 -------------------------------------------------------------------------------- HxROS Details Patient Name: Date of Service: [name], Cory RIO N L. 04/12/2023 11:15 A M Medical Record Number: [id number] Patient Account Number: [id number] Date of Birth/Sex: Treating RN: [birth year] (16 y.o. M) Primary Care Provider: Edwina Barth Other Clinician: Referring Provider: Treating Provider/Extender: Eilene Ghazi in Treatment: 47 Information Obtained From Caregiver Chart Eyes Medical History: Negative for: Cataracts; Glaucoma; Optic Neuritis [name], [name] ([id number]) [phone].pdf Page 9 of 10 Ear/Nose/Mouth/Throat Medical History: Negative for: Chronic sinus problems/congestion; Middle ear problems Past Medical History Notes: allergic rhinitis Respiratory Medical History: Positive for: Asthma - mild Gastrointestinal Medical History: Past Medical History Notes: ostomy, bowel program Endocrine Medical History: Negative for: Type I Diabetes; Type II Diabetes Genitourinary Medical History: Negative for: End Stage Renal Disease Past Medical History Notes: neurogenic bladder Integumentary (Skin) Medical History: Negative for: History of Burn Past Medical History Notes: eczema Musculoskeletal Medical History: Past Medical History Notes: contracture of left hip, spinabifida Neurologic Medical History: Positive for: Paraplegia Past  Medical History Notes: developmental delay, loloprosencephaly, hydrocephalus Oncologic Medical History: Negative for: Received Chemotherapy; Received Radiation Psychiatric Medical History: Negative for: Anorexia/bulimia; Confinement Anxiety Immunizations Pneumococcal Vaccine: Received Pneumococcal Vaccination: No Implantable Devices No devices added Hospitalization / Surgery History Type of Hospitalization/Surgery myringotomy hip surgery ventriculoperitoneal shunt Family and Social History Cancer: No; Diabetes: No; Heart Disease: No; Hereditary Spherocytosis: No; Hypertension: No; Kidney Disease: Yes - Father; Lung Disease: No; Seizures: No; Stroke: No; Thyroid Problems: No; Tuberculosis: No; Never smoker; Marital Status - Single; Alcohol Use: Never; Drug Use: No History; Caffeine Use: [name], [name] ([id number]) [phone].pdf Page 10 of 10 Moderate; Financial Concerns: No; Food, Clothing or Shelter Needs: No; Support System Lacking: No; Transportation Concerns: No Electronic Signature(s) Signed: 04/12/2023 1:54:30 PM By: Duanne Guess MD FACS Entered By: Duanne Guess on 04/12/2023 12:32:27 -------------------------------------------------------------------------------- SuperBill Details Patient Name: Date of Service: [name], Cory RIO N L. 04/12/2023 Medical Record Number: [id number] Patient Account Number: [id number] Date of Birth/Sex: Treating RN: Jan 21, 2006 (16 y.o. M) Primary Care Provider: Edwina Barth Other Clinician: Referring Provider: Treating Provider/Extender: [name] Weeks in Treatment: 25 Diagnosis Coding ICD-10 Codes Code Description L89.314 Pressure ulcer of right buttock, stage 4 M86.152 Other acute osteomyelitis, left femur Q05.2 Lumbar spina bifida with hydrocephalus K59.2 Neurogenic bowel, not elsewhere classified N31.9 Neuromuscular dysfunction of bladder, unspecified J45.20 Mild  intermittent asthma, uncomplicated Facility Procedures : CPT4 Code: [id number] Description: 97605 - WOUND VAC-50 SQ CM OR LESS Modifier: Quantity: 1 Physician Procedures : CPT4 Code Description Modifier [id number] - WC PHYS LEVEL 4 - EST PT ICD-10 Diagnosis Description L89.314 Pressure ulcer of right buttock, stage 4 M86.152 Other acute osteomyelitis, left femur Q05.2 Lumbar  spina bifida with hydrocephalus N31.9  Neuromuscular dysfunction of bladder, unspecified Quantity: 1 Electronic Signature(s) Signed: 04/12/2023 12:34:16 PM By: Duanne Guess MD FACS Entered By: Duanne Guess on 04/12/2023 12:34:16

## 2023-04-13 NOTE — Progress Notes (Signed)
SIRAJ, HUTCHINGS ([id number]) [id number] Page 1 of 8 Visit Report for 04/12/2023 Arrival Information Details Patient Name: Date of Service: Cory Mount RIO [name] 04/12/2023 11:15 A M Medical Record Number: [id number] Patient Account Number: [id number] Date of Birth/Sex: Treating RN: December 28, 2005 (16 y.o. [name] Primary Care Parks Czajkowski: Edwina Barth Other Clinician: Referring Willim Turnage: Treating Calirose Mccance/Extender: Eilene Ghazi in Treatment: 75 Visit Information History Since Last Visit Added or deleted any medications: No Patient Arrived: Wheel Chair Any new allergies or adverse reactions: No Arrival Time: 11:31 Had a fall or experienced change in No Accompanied By: grandmother activities of daily living that may affect Transfer Assistance: Manual risk of falls: Patient Identification Verified: Yes Signs or symptoms of abuse/neglect since last visito No Secondary Verification Process Completed: Yes Hospitalized since last visit: No Patient Requires Transmission-Based Precautions: No Implantable device outside of the clinic excluding No Patient Has Alerts: No cellular tissue based products placed in the center since last visit: Has Dressing in Place as Prescribed: Yes Pain Present Now: No Electronic Signature(s) Signed: 04/12/2023 5:01:06 PM By: [name] Entered By: [name] on 04/12/2023 12:03:41 -------------------------------------------------------------------------------- Encounter Discharge Information Details Patient Name: Date of Service: [name], Cory [name] 04/12/2023 11:15 A M Medical Record Number: [id number] Patient Account Number: [id number] Date of Birth/Sex: Treating RN: 2005/07/10 (16 y.o. [name] Primary Care Norie Latendresse: Edwina Barth Other Clinician: Referring Kallista Pae: Treating Landrum Carbonell/Extender: Eilene Ghazi in Treatment:  66 Encounter Discharge Information Items Discharge Condition: Stable Ambulatory Status: Wheelchair Discharge Destination: Home Transportation: Private Auto Accompanied By: grandmother Schedule Follow-up Appointment: Yes Clinical Summary of Care: Patient Declined Electronic Signature(s) Signed: 04/12/2023 5:01:06 PM By: [name] Entered By: [name] on 04/12/2023 12:03:34 [name] ([id number]) [id number] Page 2 of 8 -------------------------------------------------------------------------------- Lower Extremity Assessment Details Patient Name: Date of Service: [name] 04/12/2023 11:15 A M Medical Record Number: [id number] Patient Account Number: [id number] Date of Birth/Sex: Treating RN: 2006/02/04 (16 y.o. [name] Primary Care Ramyah Pankowski: Edwina Barth Other Clinician: Referring Debroah Shuttleworth: Treating Gemma Ruan/Extender: [name] Weeks in Treatment: 66 Electronic Signature(s) Signed: 04/12/2023 5:01:06 PM By: Gelene Mink By: [name] on 04/12/2023 11:32:34 -------------------------------------------------------------------------------- Multi Wound Chart Details Patient Name: Date of Service: [name], Cory [name] 04/12/2023 11:15 A M Medical Record Number: [id number] Patient Account Number: [id number] Date of Birth/Sex: Treating RN: 06-Dec-2005 (16 y.o. M) Primary Care Husna Krone: Edwina Barth Other Clinician: Referring Genese Quebedeaux: Treating Tasharra Nodine/Extender: [name] Weeks in Treatment: 63 Vital Signs Height(in): 60 Pulse(bpm): 99 Weight(lbs): 110 Blood Pressure(mmHg): 111/72 Body Mass Index(BMI): 21.5 Temperature(F): 97.7 Respiratory Rate(breaths/min): 16 [1:Photos:] [N/A:N/A] Right Ischium N/A N/A Wound Location: Pressure Injury N/A N/A Wounding Event: Pressure Ulcer N/A N/A Primary Etiology: Asthma,  Paraplegia N/A N/A Comorbid History: 05/11/2022 N/A N/A Date Acquired: 4 N/A N/A Weeks of Treatment: Open N/A N/A Wound Status: No N/A N/A Wound Recurrence: 3x3.2x1.5 N/A N/A Measurements Stark x W x D (cm) 7.54 N/A N/A A (cm) : rea 11.31 N/A N/A Volume (cm) : -17.20% N/A N/A % Reduction in A rea: -251.70% N/A N/A % Reduction in Volume: 12 Starting Position 1 (o'clock): 12 Ending Position 1 (o'clock): 1.3 Maximum Distance 1 (cm): Yes N/A N/A Undermining: Category/Stage IV N/A N/A Classification: Medium N/A N/A Exudate A mount: Serosanguineous N/A N/A Exudate Type: red, brown N/A N/A Exudate Color: Well defined, not attached N/A N/A Wound Margin: [name],  [name] ([id number]) [id number] Page 3 of 8 Large (67-100%) N/A N/A Granulation Amount: Red, Pink, Pale N/A N/A Granulation Quality: Small (1-33%) N/A N/A Necrotic Amount: Fat Layer (Subcutaneous Tissue): Yes N/A N/A Exposed Structures: Muscle: Yes Fascia: No Tendon: No Joint: No Bone: No None N/A N/A Epithelialization: No Abnormalities Noted N/A N/A Periwound Skin Texture: No Abnormalities Noted N/A N/A Periwound Skin Moisture: No Abnormalities Noted N/A N/A Periwound Skin Color: No Abnormality N/A N/A Temperature: Negative Pressure Wound Therapy N/A N/A Procedures Performed: Maintenance (NPWT) Treatment Notes Wound #1 (Ischium) Wound Laterality: Right Cleanser Soap and Water Discharge Instruction: May shower and wash wound with dial antibacterial soap and water prior to dressing change. Vashe 5.8 (oz) Discharge Instruction: Cleanse the wound with Vashe prior to applying a clean dressing using gauze sponges, not tissue or cotton balls. Peri-Wound Care Skin Prep Discharge Instruction: Use skin prep as directed Topical Primary Dressing Promogran Prisma Matrix, 4.34 (sq in) (silver collagen) Discharge Instruction: Moisten collagen with saline or  hydrogel NPWT Secondary Dressing Secured With Compression Wrap Compression Stockings Add-Ons Electronic Signature(s) Signed: 04/12/2023 12:26:56 PM By: Duanne Guess MD FACS Entered By: Duanne Guess on 04/12/2023 12:26:56 -------------------------------------------------------------------------------- Multi-Disciplinary Care Plan Details Patient Name: Date of Service: [name], Cory [name] 04/12/2023 11:15 A M Medical Record Number: [id number] Patient Account Number: [id number] Date of Birth/Sex: Treating RN: Mar 03, 2006 (16 y.o. [name] Primary Care Shulamis Wenberg: Edwina Barth Other Clinician: Referring Kerrington Sova: Treating Santita Hunsberger/Extender: [name] Weeks in Treatment: 20 Multidisciplinary Care Plan reviewed with physician Active Inactive Pressure [name], [name] ([id number]) [id number] Page 4 of 8 Nursing Diagnoses: Knowledge deficit related to causes and risk factors for pressure ulcer development Knowledge deficit related to management of pressures ulcers Potential for impaired tissue integrity related to pressure, friction, moisture, and shear Goals: Patient/caregiver will verbalize understanding of pressure ulcer management Date Initiated: 06/14/2022 Target Resolution Date: 04/26/2023 Goal Status: Active Interventions: Assess: immobility, friction, shearing, incontinence upon admission and as needed Assess offloading mechanisms upon admission and as needed Assess potential for pressure ulcer upon admission and as needed Treatment Activities: Patient referred for seating evaluation to ensure proper offloading : 06/14/2022 Pressure reduction/relief device ordered : 06/14/2022 Notes: Wound/Skin Impairment Nursing Diagnoses: Impaired tissue integrity Knowledge deficit related to ulceration/compromised skin integrity Goals: Patient/caregiver will verbalize understanding of skin care regimen Date  Initiated: 06/14/2022 Target Resolution Date: 04/26/2023 Goal Status: Active Ulcer/skin breakdown will have a volume reduction of 30% by week 4 Date Initiated: 06/14/2022 Date Inactivated: 07/15/2022 Target Resolution Date: 09/14/2022 Unmet Reason: requires new w/c Goal Status: Unmet cushion Ulcer/skin breakdown will have a volume reduction of 50% by week 8 Date Initiated: 07/15/2022 Date Inactivated: 09/16/2022 Target Resolution Date: 08/12/2022 Goal Status: Unmet Unmet Reason: infection Interventions: Assess patient/caregiver ability to obtain necessary supplies Assess patient/caregiver ability to perform ulcer/skin care regimen upon admission and as needed Assess ulceration(s) every visit Provide education on ulcer and skin care Treatment Activities: Skin care regimen initiated : 06/14/2022 Topical wound management initiated : 06/14/2022 Notes: Electronic Signature(s) Signed: 04/12/2023 5:01:06 PM By: [name] Entered By: [name] on 04/12/2023 11:48:30 -------------------------------------------------------------------------------- Negative Pressure Wound Therapy Maintenance (NPWT) Details Patient Name: Date of Service: Cory Mount [name] 04/12/2023 11:15 A M Medical Record Number: [id number] Patient Account Number: [id number] Date of Birth/Sex: Treating RN: [birth year] (16 y.o. [name] Primary Care Iyana Topor: Edwina Barth Other Clinician: Referring Altheria Shadoan: Treating Myiah Petkus/Extender: [name] Weeks in Treatment: 43 NPWT Maintenance  Performed for: Wound #1 Right Ischium Performed By: Cory Bruin, RN Type: Med[employer] Coverage Size (sq cm): 9.6 Pressure Type: Constant JONQUIL, TOMSIC ([id number]) [id number] Page 5 of 8 Pressure Setting: 125 mmHG Drain Type: None Primary Contact: Other : Sponge/Dressing Type: Foam- Black Date Initiated: 08/26/2022 Dressing Removed:  Yes Quantity of Sponges/Gauze Removed: 2 Canister Changed: Yes Canister Exudate Volume: 20 Dressing Reapplied: Yes Quantity of Sponges/Gauze Inserted: 2 Days On NPWT : 230 Post Procedure Diagnosis Same as Pre-procedure Electronic Signature(s) Signed: 04/12/2023 5:01:06 PM By: [name] Entered By: [name] on 04/12/2023 11:47:14 -------------------------------------------------------------------------------- Pain Assessment Details Patient Name: Date of Service: Cory Mount [name] 04/12/2023 11:15 A M Medical Record Number: [id number] Patient Account Number: [id number] Date of Birth/Sex: Treating RN: November 16, 2005 (16 y.o. [name] Primary Care Gladyes Kudo: Edwina Barth Other Clinician: Referring Tawnee Clegg: Treating Mikhayla Phillis/Extender: [name] Weeks in Treatment: 74 Active Problems Location of Pain Severity and Description of Pain Patient Has Paino No Site Locations Rate the pain. Current Pain Level: 0 Pain Management and Medication Current Pain Management: Electronic Signature(s) Signed: 04/12/2023 5:01:06 PM By: [name] Entered By: [name] on 04/12/2023 11:32:30 [name] ([id number]) [id number] Page 6 of 8 -------------------------------------------------------------------------------- Patient/Caregiver Education Details Patient Name: Date of Service: [name] 11/26/2024andnbsp11:15 A M Medical Record Number: [id number] Patient Account Number: [id number] Date of Birth/Gender: Treating RN: 11-19-2005 (16 y.o. [name] Primary Care Physician: Edwina Barth Other Clinician: Referring Physician: Treating Physician/Extender: Eilene Ghazi in Treatment: 41 Education Assessment Education Provided To: Patient Education Topics Provided Wound/Skin Impairment: Methods: Explain/Verbal Responses:  Reinforcements needed, State content correctly Electronic Signature(s) Signed: 04/12/2023 5:01:06 PM By: [name] Entered By: [name] on 04/12/2023 11:48:41 -------------------------------------------------------------------------------- Wound Assessment Details Patient Name: Date of Service: [name], [name] 04/12/2023 11:15 A M Medical Record Number: [id number] Patient Account Number: [id number] Date of Birth/Sex: Treating RN: 12-Apr-2006 (16 y.o. 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 NEAL, FOERST ([id number]) Depth: (cm) Area: (cm) Volume: (cm) % Reduction in Area: -17.2% 2 % Reduction in Volume: -251.7% [id number] Page 7 of 8 1.5 Epithelialization: None 7.54 Tunneling: No 11.31 Undermining: Yes Starting Position (o'clock): 12 Ending Position (o'clock): 12 Maximum Distance: (cm) 1.3 Wound Description Classification: Category/Stage IV Wound Margin: Well defined, not attached Exudate Amount: Medium Exudate Type: Serosanguineous Exudate Color: red, brown Foul Odor After Cleansing: No Slough/Fibrino Yes Wound Bed Granulation Amount: Large (67-100%) Exposed Structure Granulation Quality: Red, Pink, Pale Fascia Exposed: No Necrotic Amount: Small (1-33%) Fat Layer (Subcutaneous Tissue) Exposed: Yes Necrotic Quality: Adherent Slough Tendon Exposed: No Muscle Exposed: Yes Necrosis of Muscle: No Joint Exposed: No Bone Exposed: No Periwound Skin Texture Texture  Color No Abnormalities Noted: Yes No Abnormalities Noted: Yes Moisture Temperature / Pain No Abnormalities Noted: Yes Temperature: No Abnormality Treatment Notes Wound #1 (Ischium) Wound Laterality: Right Cleanser Soap and Water Discharge Instruction: May shower and wash wound with dial antibacterial soap and water prior to dressing change. Vashe 5.8 (oz) Discharge Instruction: Cleanse the wound with Vashe prior to applying a clean dressing using gauze  sponges, not tissue or cotton balls. Peri-Wound Care Skin Prep Discharge Instruction: Use skin prep as directed Topical Primary Dressing Promogran Prisma Matrix, 4.34 (sq in) (silver collagen) Discharge Instruction: Moisten collagen with saline or hydrogel NPWT Secondary Dressing Secured With Compression Wrap Compression Stockings Add-Ons Electronic Signature(s) Signed: 04/12/2023 5:01:06 PM By: [name] Entered By: [name] on 04/12/2023 11:41:58 Vitals Details -------------------------------------------------------------------------------- [name] ([id number]) [id number] Page 8 of 8 Patient Name: Date of Service: [name] 04/12/2023 11:15 A M Medical Record Number: [id number] Patient Account Number: [id number] Date of Birth/Sex: Treating RN: Nov 21, 2005 (16 y.o. [name] Primary Care Ione Sandusky: Edwina Barth Other Clinician: Referring Jaythan Hinely: Treating Alysha Doolan/Extender: [name] Weeks in Treatment: 17 Vital Signs Time Taken: 11:32 Temperature (F): 97.7 Height (in): 60 Pulse (bpm): 99 Weight (lbs): 110 Respiratory Rate (breaths/min): 16 Body Mass Index (BMI): 21.5 Blood Pressure (mmHg): 111/72 Reference Range: 80 - 120 mg / dl Electronic Signature(s) Signed: 04/12/2023 5:01:06 PM By: [name] Entered By: [name] on 04/12/2023 11:33:04

## 2023-04-25 ENCOUNTER — Encounter (HOSPITAL_BASED_OUTPATIENT_CLINIC_OR_DEPARTMENT_OTHER): Payer: Medicaid Other | Attending: Internal Medicine | Admitting: Internal Medicine

## 2023-04-25 DIAGNOSIS — J4520 Mild intermittent asthma, uncomplicated: Secondary | ICD-10-CM | POA: Insufficient documentation

## 2023-04-25 DIAGNOSIS — M86152 Other acute osteomyelitis, left femur: Secondary | ICD-10-CM | POA: Insufficient documentation

## 2023-04-25 DIAGNOSIS — L89314 Pressure ulcer of right buttock, stage 4: Secondary | ICD-10-CM | POA: Insufficient documentation

## 2023-04-25 DIAGNOSIS — K592 Neurogenic bowel, not elsewhere classified: Secondary | ICD-10-CM | POA: Diagnosis not present

## 2023-04-25 DIAGNOSIS — Q052 Lumbar spina bifida with hydrocephalus: Secondary | ICD-10-CM | POA: Insufficient documentation

## 2023-04-25 DIAGNOSIS — N319 Neuromuscular dysfunction of bladder, unspecified: Secondary | ICD-10-CM | POA: Diagnosis not present

## 2023-04-25 NOTE — Progress Notes (Signed)
AUSTIN, BROADWATER ([id number]) [id number] Page 1 of 7 Visit Report for 04/25/2023 Arrival Information Details Patient Name: Date of Service: [name] 04/25/2023 10:45 A M Medical Record Number: [id number] Patient Account Number: [id number] Date of Birth/Sex: Treating RN: 08-27-2005 (17 y.o. M) Primary Care Braniyah Besse: Edwina Barth Other Clinician: Referring Idabelle Mcpeters: Treating Neytiri Asche/Extender: Geryl Rankins in Treatment: 45 Visit Information History Since Last Visit Added or deleted any medications: No Patient Arrived: Wheel Chair Any new allergies or adverse reactions: No Arrival Time: 11:16 Had a fall or experienced change in No Accompanied By: grandma activities of daily living that may affect Transfer Assistance: None risk of falls: Patient Identification Verified: Yes Signs or symptoms of abuse/neglect since last visito No Secondary Verification Process Completed: Yes Hospitalized since last visit: No Patient Requires Transmission-Based Precautions: No Implantable device outside of the clinic excluding No Patient Has Alerts: No cellular tissue based products placed in the center since last visit: Has Dressing in Place as Prescribed: Yes Pain Present Now: No Electronic Signature(s) Signed: 04/25/2023 4:26:31 PM By: Zenaida Deed RN, BSN Entered By: Zenaida Deed on 04/25/2023 11:22:02 -------------------------------------------------------------------------------- Encounter Discharge Information Details Patient Name: Date of Service: [name], [name] 04/25/2023 10:45 A M Medical Record Number: [id number] Patient Account Number: [id number] Date of Birth/Sex: Treating RN: [birth year] (17 y.o. [name] Primary Care Mariam Helbert: Edwina Barth Other Clinician: Referring Thaddus Mcdowell: Treating Abdurahman Rugg/Extender: Geryl Rankins in Treatment: 45 Encounter Discharge  Information Items Discharge Condition: Stable Ambulatory Status: Wheelchair Discharge Destination: Home Transportation: Private Auto Accompanied By: grandmother Schedule Follow-up Appointment: Yes Clinical Summary of Care: Patient Declined Electronic Signature(s) Signed: 04/25/2023 4:26:31 PM By: Zenaida Deed RN, BSN Entered By: Zenaida Deed on 04/25/2023 12:10:43 [name] ([id number]) [id number] Page 2 of 7 -------------------------------------------------------------------------------- Lower Extremity Assessment Details Patient Name: Date of Service: [name] 04/25/2023 10:45 A M Medical Record Number: [id number] Patient Account Number: [id number] Date of Birth/Sex: Treating RN: [birth year] (17 y.o. [name] Primary Care Tauheedah Bok: Edwina Barth Other Clinician: Referring Javaria Knapke: Treating Loucinda Croy/Extender: Geryl Rankins in Treatment: 45 Electronic Signature(s) Signed: 04/25/2023 4:26:31 PM By: Zenaida Deed RN, BSN Entered By: Zenaida Deed on 04/25/2023 11:22:23 -------------------------------------------------------------------------------- Multi Wound Chart Details Patient Name: Date of Service: [name], [name] 04/25/2023 10:45 A M Medical Record Number: [id number] Patient Account Number: [id number] Date of Birth/Sex: Treating RN: 2005/11/18 (17 y.o. M) Primary Care Kaelyn Nauta: Edwina Barth Other Clinician: Referring Tifani Dack: Treating Alexsis Kathman/Extender: Geryl Rankins in Treatment: 45 Vital Signs Height(in): 60 Pulse(bpm): 86 Weight(lbs): 110 Blood Pressure(mmHg): 112/71 Body Mass Index(BMI): 21.5 Temperature(F): 97.9 Respiratory Rate(breaths/min): 18 [1:Photos:] [N/A:N/A] Right Ischium N/A N/A Wound Location: Pressure Injury N/A N/A Wounding Event: Pressure Ulcer N/A N/A Primary Etiology: Asthma, Paraplegia N/A N/A Comorbid  History: 05/11/2022 N/A N/A Date Acquired: 45 N/A N/A Weeks of Treatment: Open N/A N/A Wound Status: No N/A N/A Wound Recurrence: 3x3.5x1.7 N/A N/A Measurements L x W x D (cm) 8.247 N/A N/A A (cm) : rea 14.019 N/A N/A Volume (cm) : -28.20% N/A N/A % Reduction in A rea: -335.90% N/A N/A % Reduction in Volume: 12 Starting Position 1 (o'clock): 12 Ending Position 1 (o'clock): 3.4 Maximum Distance 1 (cm): Yes N/A N/A Undermining: Category/Stage IV N/A N/A Classification: Medium N/A N/A Exudate A mount: Sanguinous N/A N/A Exudate Type: red N/A N/A Exudate Color: Well defined, not attached N/A N/A  Wound Margin: ADWIN, CARRAHER ([id number]) [id number] Page 3 of 7 Large (67-100%) N/A N/A Granulation Amount: Red, Pink, Pale N/A N/A Granulation Quality: Small (1-33%) N/A N/A Necrotic Amount: Fat Layer (Subcutaneous Tissue): Yes N/A N/A Exposed Structures: Muscle: Yes Fascia: No Tendon: No Joint: No Bone: No None N/A N/A Epithelialization: No Abnormalities Noted N/A N/A Periwound Skin Texture: No Abnormalities Noted N/A N/A Periwound Skin Moisture: No Abnormalities Noted N/A N/A Periwound Skin Color: No Abnormality N/A N/A Temperature: Treatment Notes Electronic Signature(s) Signed: 04/25/2023 4:43:54 PM By: Baltazar Najjar MD Entered By: Baltazar Najjar on 04/25/2023 12:00:17 -------------------------------------------------------------------------------- Multi-Disciplinary Care Plan Details Patient Name: Date of Service: [name], [name] 04/25/2023 10:45 A M Medical Record Number: [id number] Patient Account Number: [id number] Date of Birth/Sex: Treating RN: Sep 12, 2005 (17 y.o. [name] Primary Care Sinan Tuch: Edwina Barth Other Clinician: Referring Colby Catanese: Treating Dagoberto Nealy/Extender: Geryl Rankins in Treatment: 45 Multidisciplinary Care Plan reviewed with physician Active  Inactive Pressure Nursing Diagnoses: Knowledge deficit related to causes and risk factors for pressure ulcer development Knowledge deficit related to management of pressures ulcers Potential for impaired tissue integrity related to pressure, friction, moisture, and shear Goals: Patient/caregiver will verbalize understanding of pressure ulcer management Date Initiated: 06/14/2022 Target Resolution Date: 05/30/2023 Goal Status: Active Interventions: Assess: immobility, friction, shearing, incontinence upon admission and as needed Assess offloading mechanisms upon admission and as needed Assess potential for pressure ulcer upon admission and as needed Treatment Activities: Patient referred for seating evaluation to ensure proper offloading : 06/14/2022 Pressure reduction/relief device ordered : 06/14/2022 Notes: Wound/Skin Impairment Nursing Diagnoses: Impaired tissue integrity Knowledge deficit related to ulceration/compromised skin integrity Goals: Patient/caregiver will verbalize understanding of skin care regimen Date Initiated: 06/14/2022 Target Resolution Date: 05/31/2023 Goal Status: Active ARVELL, MALTEZ ([id number]) [phone].pdf Page 4 of 7 Ulcer/skin breakdown will have a volume reduction of 30% by week 4 Date Initiated: 06/14/2022 Date Inactivated: 07/15/2022 Target Resolution Date: 09/14/2022 Unmet Reason: requires new w/c Goal Status: Unmet cushion Ulcer/skin breakdown will have a volume reduction of 50% by week 8 Date Initiated: 07/15/2022 Date Inactivated: 09/16/2022 Target Resolution Date: 08/12/2022 Goal Status: Unmet Unmet Reason: infection Interventions: Assess patient/caregiver ability to obtain necessary supplies Assess patient/caregiver ability to perform ulcer/skin care regimen upon admission and as needed Assess ulceration(s) every visit Provide education on ulcer and skin care Treatment Activities: Skin care regimen initiated :  06/14/2022 Topical wound management initiated : 06/14/2022 Notes: Electronic Signature(s) Signed: 04/25/2023 4:26:31 PM By: Zenaida Deed RN, BSN Entered By: Zenaida Deed on 04/25/2023 11:31:01 -------------------------------------------------------------------------------- Pain Assessment Details Patient Name: Date of Service: [name], [name] 04/25/2023 10:45 A M Medical Record Number: [id number] Patient Account Number: [id number] Date of Birth/Sex: Treating RN: 2005-07-03 (16 y.o. M) Primary Care Judee Hennick: Edwina Barth Other Clinician: Referring Dianey Suchy: Treating Kealie Barrie/Extender: Geryl Rankins in Treatment: 45 Active Problems Location of Pain Severity and Description of Pain Patient Has Paino No Site Locations Rate the pain. Current Pain Level: 0 Pain Management and Medication Current Pain Management: Electronic Signature(s) Signed: 04/25/2023 4:26:31 PM By: Zenaida Deed RN, BSN Entered By: Zenaida Deed on 04/25/2023 11:22:14 [name] ([id number]) [id number] Page 5 of 7 -------------------------------------------------------------------------------- Patient/Caregiver Education Details Patient Name: Date of Service: [name] 12/9/2024andnbsp10:45 A M Medical Record Number: [id number] Patient Account Number: [id number] Date of Birth/Gender: Treating RN: 01-01-2006 (16 y.o. [name] Primary Care Physician: Edwina Barth Other Clinician: Referring Physician: Treating  Physician/Extender: Geryl Rankins in Treatment: 45 Education Assessment Education Provided To: Patient Education Topics Provided Pressure: Methods: Explain/Verbal Responses: Reinforcements needed, State content correctly Wound/Skin Impairment: Methods: Explain/Verbal Responses: Reinforcements needed Electronic Signature(s) Signed: 04/25/2023 4:26:31 PM By: Zenaida Deed RN,  BSN Entered By: Zenaida Deed on 04/25/2023 11:31:31 -------------------------------------------------------------------------------- Wound Assessment Details Patient Name: Date of Service: [name], [name] 04/25/2023 10:45 A M Medical Record Number: [id number] Patient Account Number: [id number] Date of Birth/Sex: Treating RN: 2005-10-22 (16 y.o. M) Primary Care Kirstan Fentress: Edwina Barth Other Clinician: Referring Aleza Pew: Treating Markala Sitts/Extender: Stasia Cavalier Weeks in Treatment: 45 Wound Status Wound Number: 1 Primary Etiology: Pressure Ulcer Wound Location: Right Ischium Wound Status: Open Wounding Event: Pressure Injury Comorbid History: Asthma, Paraplegia Date Acquired: 05/11/2022 Weeks Of Treatment: 45 Clustered Wound: No Photos LILLARD, VANDERHOEK ([id number]) [id number].pdf Page 6 of 7 Wound Measurements Length: (cm) 3 Width: (cm) 3.5 Depth: (cm) 1.7 Area: (cm) 8.247 Volume: (cm) 14.019 % Reduction in Area: -28.2% % Reduction in Volume: -335.9% Epithelialization: None Tunneling: No Undermining: Yes Starting Position (o'clock): 12 Ending Position (o'clock): 12 Maximum Distance: (cm) 3.4 Wound Description Classification: Category/Stage IV Wound Margin: Well defined, not attached Exudate Amount: Medium Exudate Type: Sanguinous Exudate Color: red Foul Odor After Cleansing: No Slough/Fibrino Yes Wound Bed Granulation Amount: Large (67-100%) Exposed Structure Granulation Quality: Red, Pink, Pale Fascia Exposed: No Necrotic Amount: Small (1-33%) Fat Layer (Subcutaneous Tissue) Exposed: Yes Necrotic Quality: Adherent Slough Tendon Exposed: No Muscle Exposed: Yes Necrosis of Muscle: No Joint Exposed: No Bone Exposed: No Periwound Skin Texture Texture Color No Abnormalities Noted: Yes No Abnormalities Noted: Yes Moisture Temperature / Pain No Abnormalities Noted: Yes Temperature: No  Abnormality Treatment Notes Wound #1 (Ischium) Wound Laterality: Right Cleanser Soap and Water Discharge Instruction: May shower and wash wound with dial antibacterial soap and water prior to dressing change. Vashe 5.8 (oz) Discharge Instruction: Cleanse the wound with Vashe prior to applying a clean dressing using gauze sponges, not tissue or cotton balls. Peri-Wound Care Skin Prep Discharge Instruction: Use skin prep as directed Topical Primary Dressing Promogran Prisma Matrix, 4.34 (sq in) (silver collagen) Discharge Instruction: Moisten collagen with saline or hydrogel Woven Gauze Sponge, Non-Sterile 4x4 (in/in) Discharge Instruction: moisten with saline and pack into wound, be sure to get into undermined areas Secondary Dressing Zetuvit Plus Silicone Border Dressing 5x5 (in/in) Discharge Instruction: Apply silicone border over primary dressing as directed. Secured With Compression Wrap Compression Stockings Facilities manager) Signed: 04/25/2023 4:26:31 PM By: Zenaida Deed RN, BSN Entered By: Zenaida Deed on 04/25/2023 11:29:22 [name] ([id number]) [id number] Page 7 of 7 -------------------------------------------------------------------------------- Vitals Details Patient Name: Date of Service: [name] 04/25/2023 10:45 A M Medical Record Number: [id number] Patient Account Number: [id number] Date of Birth/Sex: Treating RN: 12/16/2005 (16 y.o. M) Primary Care Oran Dillenburg: Edwina Barth Other Clinician: Referring Lorelee Mclaurin: Treating Margareta Laureano/Extender: Geryl Rankins in Treatment: 45 Vital Signs Time Taken: 11:17 Temperature (F): 97.9 Height (in): 60 Pulse (bpm): 86 Weight (lbs): 110 Respiratory Rate (breaths/min): 18 Body Mass Index (BMI): 21.5 Blood Pressure (mmHg): 112/71 Reference Range: 80 - 120 mg / dl Electronic Signature(s) Signed: 04/25/2023 4:26:31 PM By: Zenaida Deed RN, BSN Entered By: Zenaida Deed on 04/25/2023 11:22:07

## 2023-04-25 NOTE — Progress Notes (Signed)
Stark, Cory ([id number]) [id number] Page 1 of 8 Visit Report for 04/25/2023 HPI Details Patient Name: Date of Service: [name] RIO [name] 04/25/2023 10:45 Cory M Medical Record Number: [id number] Patient Account Number: [id number] Date of Birth/Sex: Treating RN: May 01, 2006 (17 y.o. 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 Around Christmas time, he developed Cory pressure ulcer on his right ischium. This seems to be related to the cushioning in his wheelchair. They have an appointment coming up on Wednesday to have this checked and addressed. For some reason he has been prescribed doxycycline and Flagyl and has Cory couple days left of this. They have just been covering the site with gauze. On his right ischial area, he has an oval wound that extends into the fat layer. There is some slough and fibrinous exudate present on the surface. There is no erythema, induration, malodor, or purulent drainage to suggest infection. 06/21/2022: The wound measured larger and deeper today. There is evidence of pressure induced tissue injury and the surface is Cory bit dry with fibrinous exudate accumulation. He does not yet have Cory new cushion for his wheelchair. 06/30/2022: His wound is deeper again. The surface is very clean. They did obtain the eggcrate cushion, but did not cut out an area to offload his ulcer. 07/07/2022: His wound measured Cory little bit smaller today. There is slough on the wound surface. For some reason they did not get the Iodosorb gel and have been using Iodosorb pads; I do not think these are doing as good Cory job of chemical debridement as the gel would. They did cut  out the space in his eggcrate foam cushion to accommodate his wound and I think this is helpful. 07/15/2022: His wound is deeper today. The tissue is pale, but the wound is fairly clean. For reasons that remain unclear, his wheelchair cushion has not been replaced and his caregivers have not contacted [employer] to do anything about it. 07/20/2022: His wound is Cory little bit smaller. There is still Cory lot of undermining. He has more slough accumulation today. We ordered him Cory Roho cushion last week, but he has not yet received it. 3/13; patient presents for follow-up. He has been using Hydrofera Blue to the right ischium wound bed. He has no issues or complaints today. 08/05/2022: The color of the tissue in the wound bed has improved markedly. It is much more beefy and red, whereas previously it has been quite pale. There is some slough on the wound surface. He finally got his custom molded wheelchair cushion for his school chair and Cory Roho cushion for home. There was an error on the part of the DME company for his low-air-loss mattress bed, but this is in the process of being resolved. 08/26/2022: The wound is measuring Cory little deeper; it looks like some fat simply separated in the center of his wound, rather than worsening of the pressure injury. The quality of the tissue continues to improve. His low-air-loss mattress was finally delivered. He has his wound VAC with him for application today. 09/02/2022: The wound continues to worsen. I can palpate his trochanter in the deepest part of the wound, although the bone is not exposed. There is Cory strong odor coming from the wound and the  tissue surface is gray. 09/09/2022: The progression of the wound seems to have been arrested. The color is markedly improved and is now beefy red. There are still some areas of the tissue that are more purpleish, suggesting ongoing pressure induced injury. The odor has abated. The culture that I took produced Cory polymicrobial  population including Proteus, Pseudomonas, and multiple other species. He is currently taking levofloxacin and metronidazole. 09/16/2022: The wound continues to improve. The color and is red and the surface is appropriately moist. He has been in his Roho cushion and I do not see any areas of further pressure induced injury. He is completing his course of oral levofloxacin and Flagyl. 09/23/2022: No pressure induced tissue damage this week. There is no odor coming from the wound. No significant changes to the wound dimensions, however. 09/29/2022: The wound size remains about the same. The tissues are looking Cory bit healthier. There is slough on the wound surface. 10/07/2022: The wound is looking better. The undermining at 12:00 has closed down. The tunneling that extends over the trochanter is still present, but is tighter and narrower. The tissues look healthier. There is an odor, however, on the dressing. 10/14/2022: There has been very nice improvement in the wound over the past week. The tunneling of the trochanter is shorter by Cory good half centimeter and the tissue is much tighter. Near 12:00 the wound is nearly flush with the surrounding skin surface. The odor has resolved. 10/25/2022: He had his wound VAC off for graduation and there has been some breakdown over the trochanter. The bone is not yet exposed, but the tissue is thinner. There is Cory layer of slough on the wound surface. 11/03/2022: There has been nice improvement since our last visit. The tissue over the trochanter has filled again and the space is contracting. The granulation tissue appears healthy and there is no slough. No evidence of ongoing pressure-induced tissue injury. 11/09/2022: The wound continues to improve. The undermined area is filling in more, but the tissue over the trochanter is still quite thin. There is minimal biofilm on the surface and no evidence of pressure induced tissue injury today. 11/16/2022: The undermined portion  of the wound has filled in even more and the tissue over the trochanter is starting to get Cory bit thicker. Everything is extremely clean today without any odor or slough accumulation. DANFORD, WORTHINGTON ([id number]) [id number] Page 2 of 8 11/22/2022: The undermined area of the wound continues to contract. The wound is very clean. The tissue is robust and beefy red. 12/14/2022: The undermined area has come in by almost Cory cm. The tissue over the trochanter feels thicker. The wound is clean without any odor. 01/03/2023: The wound is stable, but there is some purpleish discoloration directly over the ischium consistent with pressure in this area. He has Cory little bit of slough accumulation around the edges of the wound. There is also an odor to the wound, despite use of topical gentamicin and mupirocin. 01/19/2023: The wound has filled in and the cavity is not as deep. The area overlying the trochanter is closing in. I do not appreciate any pressure-related tissue injury. There is some slough on the surface. The odor has abated. He is currently still taking the Augmentin I prescribed in response to his culture data. The x- ray that was done was concerning for possible osteomyelitis and he has had an MRI, but this has not yet been read. His albumin is normal. The referral to plastic surgery is  on hold while we complete this testing and I suspect he Cory require treatment for osteomyelitis before they Cory agree to see him. 02/03/2023: The wound continues to contract. No pressure-related tissue injury. No odor. He saw infectious disease yesterday for osteomyelitis and Cory receive Dalvance infusions on Cory weekly basis. He does have an appointment to see plastics at [facility] coming up on the 26th of this month. 02/16/2023: There is senescent skin that has built up around the wound edges and some biofilm on the surface. The area overlying the trochanter has gotten more closed in and tighter. The  external wound dimensions are about the same. He has completed his Dalvance treatment. When he saw plastic surgery last week, they recommended pressure mapping and intervention with different offloading including Cory new seat cushion, Cory reclining wheelchair to help Cory lot of the ischium and to decrease the amount of time he spends in his wheelchair. He was advised to spend most of his time lying in bed. He was advised to offload the area completely for 3 months and allow the [facility] more time to work. 03/01/2023: The wound is extremely clean today and is getting shallower with less undermining. The area over the trochanter is quite Cory bit tighter. There is no evidence of pressure induced tissue injury. 03/16/2023: I can no longer get Cory finger over the top of his trochanter. The tissue remains pink and viable although it does tend towards the dry side, slightly. 03/30/2023: The undermined area continues to contract, but there has been deterioration at the center of the wound directly overlying the ischium. Bone is not frankly exposed yet, but I am concerned this may occur if there is further deterioration. There is also Cory small tunnel that seems to be just separation of the muscle layers at about the 12 o'clock position of the wound. It is difficult to find with Cory skinny probe. There is no malodor or purulent drainage. 04/12/2023: Fortunately, the wound seems to have rebounded from the last visit. Bone is better covered at the ischial site. The tunnel that was identified last week could not be appreciated. The tissue has closed and even more securely over the top of his trochanter. No malodor or purulent drainage. 12/9; per description of our intake nurse things have continued to deteriorate over the last 2 weeks. He arrives in clinic today with the [facility] canister full of sanguinous drainage. He had Cory bleeding area superiorly in the wound that we are able to get control of. Also concerning is the minimal amount of  tissue over the ischial tuberosity itself. They informed us today that his Roho cushion in his wheelchair is no longer working and its relatively new [facility]) Signed: 04/25/2023 4:43:54 PM By: Baltazar Najjar MD Entered By: Baltazar Najjar on 04/25/2023 12:02:15 -------------------------------------------------------------------------------- Physical Exam Details Patient Name: Date of Service: [name], [name] 04/25/2023 10:45 Cory M Medical Record Number: [id number] Patient Account Number: [id number] Date of Birth/Sex: Treating RN: 2005/11/20 (16 y.o. M) Primary Care Provider: Edwina Barth Other Clinician: Referring Provider: Treating Provider/Extender: Geryl Rankins in Treatment: 45 Constitutional Sitting or standing Blood Pressure is within target range for patient.. Pulse regular and within target range for patient.Marland Kitchen Respirations regular, non-labored and within target range.. Temperature is normal and within the target range for the patient.Marland Kitchen Appears in no distress. Notes Wound exam; right ischial tuberosity. This probes and tunnels superiorly. We could not see the source of the bleeding but the bleeding  was coming from this area. Also concerning is there is very little tissue over bone which I gather was quite Cory deterioration from 2 to 3 weeks ago. There is no surrounding infection no purulence. Electronic Signature(s) Signed: 04/25/2023 4:43:54 PM By: Baltazar Najjar MD Entered By: Baltazar Najjar on 04/25/2023 12:03:27 [name] ([id number]) [id number] Page 3 of 8 -------------------------------------------------------------------------------- Physician Orders Details Patient Name: Date of Service: Cory Loralie Champagne 04/25/2023 10:45 Cory M Medical Record Number: [id number] Patient Account Number: [id number] Date of Birth/Sex: Treating RN: 05/09/2006 (16 y.o. 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 - Dr. Lady Gary RM 1 Return Cory Anesthetic Wound #1 Right Ischium (In clinic) Topical Lidocaine 4% applied to wound bed Bathing/ Shower/ Hygiene May shower and wash wound with soap and water. Negative Presssure Wound Therapy Medela Wound Vac continuously at 170mm/hg Black Foam Off-Loading Low air-loss mattress (Group 2) - ordered through [employer] Roho cushion for wheelchair - sent referral to [employer] Turn and reposition every 2 hours - use arms to lift up off the wheelchair at least hourly while up in wheelchair, avoid sitting for more than one hour increments Additional Orders / Instructions Follow Nutritious Diet - 70- 100 gms of protein per day. add protein drink such as Premeir Protein 2 times per day. Use the Juven for added protein intake. Wound Treatment Wound #1 - Ischium Wound Laterality: Right Cleanser: Soap and Water 1 x Per Day/30 Days Discharge Instructions: May shower and wash wound with dial antibacterial soap and water prior to dressing change. Cleanser: Vashe 5.8 (oz) 1 x Per Day/30 Days Discharge Instructions: Cleanse the wound with Vashe prior to applying Cory clean dressing using gauze sponges, not tissue or cotton balls. Peri-Wound Care: Skin Prep 1 x Per Day/30 Days Discharge Instructions: Use skin prep as directed Prim Dressing: Promogran Prisma Matrix, 4.34 (sq in) (silver collagen) 1 x Per Day/30  Days ary Discharge Instructions: Moisten collagen with saline or hydrogel Prim Dressing: Woven Gauze Sponge, Non-Sterile 4x4 (in/in) 1 x Per Day/30 Days ary Discharge Instructions: moisten with saline and pack into wound, be sure to get into undermined areas Secondary Dressing: Zetuvit Plus Silicone Border Dressing 5x5 (in/in) 1 x Per Day/30 Days Discharge Instructions: Apply silicone border over primary dressing as directed. Electronic Signature(s) Signed: 04/25/2023 4:26:31 PM By: Zenaida Deed RN, BSN Signed: 04/25/2023 4:43:54 PM By: Baltazar Najjar MD Entered By: Zenaida Deed on 04/25/2023 11:56:43 [name] ([id number]) [id number] Page 4 of 8 Prescription 04/25/2023 -------------------------------------------------------------------------------- Willy Eddy MD Patient Name: Provider: May 07, 2006 [id number] Date of Birth: NPI#: [id number] Sex: DEA #: [phone] Phone #: License #: [id number] UPN: Patient Address: [phone] Eligha Bridegroom [facility] [place] [place], [place] [address] [address] [place], [place] [address] [phone] Allergies latex Provider's Orders Roho cushion for wheelchair - sent referral to [employer] Hand Signature: Date(s): Electronic Signature(s) Signed: 04/25/2023 4:26:31 PM By: Zenaida Deed RN, BSN Signed: 04/25/2023 4:43:54 PM By: Baltazar Najjar MD Entered By: Zenaida Deed on 04/25/2023 11:56:44 -------------------------------------------------------------------------------- Problem List  Details Patient Name: Date of Service: Sheryn Bison 04/25/2023 10:45 Cory M Medical Record Number: [id number] Patient Account Number: [id number] Date of Birth/Sex: Treating RN: [birth year] (16 y.o. 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 04/25/2023 10:45 Cory M Medical Record Number: [id number] Patient Account Number: [id number] Date of Birth/Sex: Treating RN: 2005-10-12 (16 y.o. 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 Around Christmas time, he developed Cory pressure ulcer on his right ischium. This seems to be related to the cushioning in his wheelchair. They have an appointment coming up on Wednesday to have this checked and addressed. For some reason he has been prescribed doxycycline and Flagyl and has Cory couple days left of this. They have just been covering the site with gauze. On his right ischial area, he has an oval wound that extends into the fat layer. There is some slough and fibrinous exudate present on the surface. There is  no erythema, induration, malodor, or purulent drainage to suggest infection. 06/21/2022: The wound measured larger and deeper today. There is evidence of pressure induced tissue injury and the surface is Cory bit dry with fibrinous exudate accumulation. He does not yet have Cory new cushion for his wheelchair. 06/30/2022: His wound is deeper again. The surface is very clean. They did obtain the eggcrate cushion, but did not cut out an area to offload his ulcer. 07/07/2022: His wound measured Cory little bit smaller today. There is slough on the wound surface. For some reason they did not get the Iodosorb gel and have been using Iodosorb pads; I do not think these are doing as good Cory job of chemical debridement as the gel would. They did cut out the space in his eggcrate foam cushion to accommodate his wound and I think this is helpful. 07/15/2022: His wound is deeper today. The tissue is pale, but the wound is fairly clean. For reasons that remain unclear, his wheelchair cushion has not been replaced and his caregivers have not contacted [employer] to do anything about it. 07/20/2022: His wound is Cory little bit smaller. There is still Cory lot of undermining. He has more slough accumulation today. We ordered him Cory Roho cushion last week, but he has not yet received it. 3/13; patient presents for follow-up. He has been using Hydrofera Blue to the right ischium wound bed. He has no issues or complaints today. 08/05/2022: The color of the tissue in the wound bed has improved markedly. It is much more beefy and red, whereas previously it has been  quite pale. There is some slough on the wound surface. He finally got his custom molded wheelchair cushion for his school chair and Cory Roho cushion for home. There was an error on the part of the DME company for his low-air-loss mattress bed, but this is in the process of being resolved. 08/26/2022: The wound is measuring Cory little deeper; it looks like some fat simply separated in the  center of his wound, rather than worsening of the pressure injury. The quality of the tissue continues to improve. His low-air-loss mattress was finally delivered. He has his wound VAC with him for application today. 09/02/2022: The wound continues to worsen. I can palpate his trochanter in the deepest part of the wound, although the bone is not exposed. There is Cory strong odor coming from the wound and the tissue surface is gray. 09/09/2022: The progression of the wound seems to have been arrested. The color is markedly improved and is now beefy red. There are still some areas of the tissue that are more purpleish, suggesting ongoing pressure induced injury. The odor has abated. The culture that I took produced Cory polymicrobial population including Proteus, Pseudomonas, and multiple other species. He is currently taking levofloxacin and metronidazole. 09/16/2022: The wound continues to improve. The color and is red and the surface is appropriately moist. He has been in his Roho cushion and I do not see any areas of further pressure induced injury. He is completing his course of oral levofloxacin and Flagyl. 09/23/2022: No pressure induced tissue damage this week. There is no odor coming from the wound. No significant changes to the wound dimensions, however. 09/29/2022: The wound size remains about the same. The tissues are looking Cory bit healthier. There is slough on the wound surface. DANNEY, HANNEY ([id number]) [id number] Page 6 of 8 10/07/2022: The wound is looking better. The undermining at 12:00 has closed down. The tunneling that extends over the trochanter is still present, but is tighter and narrower. The tissues look healthier. There is an odor, however, on the dressing. 10/14/2022: There has been very nice improvement in the wound over the past week. The tunneling of the trochanter is shorter by Cory good half centimeter and the tissue is much tighter. Near 12:00 the wound  is nearly flush with the surrounding skin surface. The odor has resolved. 10/25/2022: He had his wound VAC off for graduation and there has been some breakdown over the trochanter. The bone is not yet exposed, but the tissue is thinner. There is Cory layer of slough on the wound surface. 11/03/2022: There has been nice improvement since our last visit. The tissue over the trochanter has filled again and the space is contracting. The granulation tissue appears healthy and there is no slough. No evidence of ongoing pressure-induced tissue injury. 11/09/2022: The wound continues to improve. The undermined area is filling in more, but the tissue over the trochanter is still quite thin. There is minimal biofilm on the surface and no evidence of pressure induced tissue injury today. 11/16/2022: The undermined portion of the wound has filled in even more and the tissue over the trochanter is starting to get Cory bit thicker. Everything is extremely clean today without any odor or slough accumulation. 11/22/2022: The undermined area of the wound continues to contract. The wound is very clean. The tissue is robust and beefy red. 12/14/2022: The undermined area has come in by almost Cory cm. The tissue over the trochanter feels thicker. The wound is clean without any  odor. 01/03/2023: The wound is stable, but there is some purpleish discoloration directly over the ischium consistent with pressure in this area. He has Cory little bit of slough accumulation around the edges of the wound. There is also an odor to the wound, despite use of topical gentamicin and mupirocin. 01/19/2023: The wound has filled in and the cavity is not as deep. The area overlying the trochanter is closing in. I do not appreciate any pressure-related tissue injury. There is some slough on the surface. The odor has abated. He is currently still taking the Augmentin I prescribed in response to his culture data. The x- ray that was done was concerning for possible  osteomyelitis and he has had an MRI, but this has not yet been read. His albumin is normal. The referral to plastic surgery is on hold while we complete this testing and I suspect he Cory require treatment for osteomyelitis before they Cory agree to see him. 02/03/2023: The wound continues to contract. No pressure-related tissue injury. No odor. He saw infectious disease yesterday for osteomyelitis and Cory receive Dalvance infusions on Cory weekly basis. He does have an appointment to see plastics at [facility] coming up on the 26th of this month. 02/16/2023: There is senescent skin that has built up around the wound edges and some biofilm on the surface. The area overlying the trochanter has gotten more closed in and tighter. The external wound dimensions are about the same. He has completed his Dalvance treatment. When he saw plastic surgery last week, they recommended pressure mapping and intervention with different offloading including Cory new seat cushion, Cory reclining wheelchair to help Cory lot of the ischium and to decrease the amount of time he spends in his wheelchair. He was advised to spend most of his time lying in bed. He was advised to offload the area completely for 3 months and allow the [facility] more time to work. 03/01/2023: The wound is extremely clean today and is getting shallower with less undermining. The area over the trochanter is quite Cory bit tighter. There is no evidence of pressure induced tissue injury. 03/16/2023: I can no longer get Cory finger over the top of his trochanter. The tissue remains pink and viable although it does tend towards the dry side, slightly. 03/30/2023: The undermined area continues to contract, but there has been deterioration at the center of the wound directly overlying the ischium. Bone is not frankly exposed yet, but I am concerned this may occur if there is further deterioration. There is also Cory small tunnel that seems to be just separation of the muscle layers at  about the 12 o'clock position of the wound. It is difficult to find with Cory skinny probe. There is no malodor or purulent drainage. 04/12/2023: Fortunately, the wound seems to have rebounded from the last visit. Bone is better covered at the ischial site. The tunnel that was identified last week could not be appreciated. The tissue has closed and even more securely over the top of his trochanter. No malodor or purulent drainage. 12/9; per description of our intake nurse things have continued to deteriorate over the last 2 weeks. He arrives in clinic today with the [facility] canister full of sanguinous drainage. He had Cory bleeding area superiorly in the wound that we are able to get control of. Also concerning is the minimal amount of tissue over the ischial tuberosity itself. They informed us today that his Roho cushion in his wheelchair is no longer working and its  relatively new Objective Constitutional Sitting or standing Blood Pressure is within target range for patient.. Pulse regular and within target range for patient.Marland Kitchen Respirations regular, non-labored and within target range.. Temperature is normal and within the target range for the patient.Marland Kitchen Appears in no distress. Vitals Time Taken: 11:17 AM, Height: 60 in, Weight: 110 lbs, BMI: 21.5, Temperature: 97.9 F, Pulse: 86 bpm, Respiratory Rate: 18 breaths/min, Blood Pressure: 112/71 mmHg. General Notes: Wound exam; right ischial tuberosity. This probes and tunnels superiorly. We could not see the source of the bleeding but the bleeding was coming from this area. Also concerning is there is very little tissue over bone which I gather was quite Cory deterioration from 2 to 3 weeks ago. There is no surrounding infection no purulence. Integumentary (Hair, Skin) Wound #1 status is Open. Original cause of wound was Pressure Injury. The date acquired was: 05/11/2022. The wound has been in treatment 45 weeks. The wound is located on the Right Ischium. The wound  measures 3cm length x 3.5cm width x 1.7cm depth; 8.247cm^2 area and 14.019cm^3 volume. There is muscle and Fat Layer (Subcutaneous Tissue) exposed. There is no tunneling noted, however, there is undermining starting at 12:00 and ending at 12:00 with Cory maximum distance of 3.4cm. There is Cory medium amount of sanguinous drainage noted. The wound margin is well defined and not attached to the wound base. There is large (67-100%) red, pink, pale granulation within the wound bed. There is Cory small (1-33%) amount of necrotic tissue within the wound bed including Adherent Slough. The periwound skin appearance had no abnormalities noted for texture. The periwound skin appearance had no abnormalities noted for moisture. The periwound skin appearance had no abnormalities noted for color. Periwound temperature was noted as No Abnormality. TYWAUN, TROUP ([id number]) [id number] Page 7 of 8 Assessment Active Problems ICD-10 Pressure ulcer of right buttock, stage 4 Other acute osteomyelitis, left femur Lumbar spina bifida with hydrocephalus Neurogenic bowel, not elsewhere classified Neuromuscular dysfunction of bladder, unspecified Mild intermittent asthma, uncomplicated Plan Follow-up Appointments: Return Appointment in 2 weeks. - Dr. Lady Gary RM 1 Anesthetic: Wound #1 Right Ischium: (In clinic) Topical Lidocaine 4% applied to wound bed Bathing/ Shower/ Hygiene: May shower and wash wound with soap and water. Negative Presssure Wound Therapy: Medela Wound Vac continuously at 151mm/hg Black Foam Off-Loading: Low air-loss mattress (Group 2) - ordered through [employer] Roho cushion for wheelchair - sent referral to [employer] Turn and reposition every 2 hours - use arms to lift up off the wheelchair at least hourly while up in wheelchair, avoid sitting for more than one hour increments Additional Orders / Instructions: Follow Nutritious Diet - 70- 100 gms of protein per day.  add protein drink such as Premeir Protein 2 times per day. Use the Juven for added protein intake. WOUND #1: - Ischium Wound Laterality: Right Cleanser: Soap and Water 1 x Per Day/30 Days Discharge Instructions: May shower and wash wound with dial antibacterial soap and water prior to dressing change. Cleanser: Vashe 5.8 (oz) 1 x Per Day/30 Days Discharge Instructions: Cleanse the wound with Vashe prior to applying Cory clean dressing using gauze sponges, not tissue or cotton balls. Peri-Wound Care: Skin Prep 1 x Per Day/30 Days Discharge Instructions: Use skin prep as directed Prim Dressing: Promogran Prisma Matrix, 4.34 (sq in) (silver collagen) 1 x Per Day/30 Days ary Discharge Instructions: Moisten collagen with saline or hydrogel Prim Dressing: Woven Gauze Sponge, Non-Sterile 4x4 (in/in) 1 x Per Day/30 Days ary Discharge Instructions:  moisten with saline and pack into wound, be sure to get into undermined areas Secondary Dressing: Zetuvit Plus Silicone Border Dressing 5x5 (in/in) 1 x Per Day/30 Days Discharge Instructions: Apply silicone border over primary dressing as directed. 1. I did not see any other reasonable option but to put the wound VAC on hold. I could not see the bleeding site but we did manage to get this to stop bleeding. The canister was full of blood were sanguinous drainage. 2. No clear evidence of infection. 3. The other concerning thing was the lack of tissue over the ischial tuberosity this would suggest that there is Cory significant problem. I suspect this was unrelieved pressure because of the Roho cushion issue. We are calling [employer] to try and address this or at least his grandmother is. 4 I have emphasized the need to keep the area off his wheelchair and have suggested bedrest turning etc. for now. Electronic Signature(s) Signed: 04/25/2023 4:43:54 PM By: Baltazar Najjar MD Entered By: Baltazar Najjar on 04/25/2023  12:05:44 -------------------------------------------------------------------------------- SuperBill Details Patient Name: Date of Service: [name], [name] 04/25/2023 Medical Record Number: [id number] Patient Account Number: [id number] Date of Birth/Sex: Treating RN: [birth year] (16 y.o. M) Primary Care Provider: Edwina Barth Other Clinician: Referring Provider: Treating Provider/Extender: Geryl Rankins in Treatment: [address], [place] L ([id number]) [id number] Page 8 of 8 Diagnosis Coding ICD-10 Codes Code Description L89.314 Pressure ulcer of right buttock, stage 4 M86.152 Other acute osteomyelitis, left femur Q05.2 Lumbar spina bifida with hydrocephalus K59.2 Neurogenic bowel, not elsewhere classified N31.9 Neuromuscular dysfunction of bladder, unspecified J45.20 Mild intermittent asthma, uncomplicated Physician Procedures : CPT4 Code Description Modifier [id number] - WC PHYS LEVEL 3 - EST PT ICD-10 Diagnosis Description L89.314 Pressure ulcer of right buttock, stage 4 Quantity: 1 Electronic Signature(s) Signed: 04/25/2023 4:43:54 PM By: Baltazar Najjar MD Entered By: Baltazar Najjar on 04/25/2023 12:06:08

## 2023-05-12 ENCOUNTER — Encounter (HOSPITAL_BASED_OUTPATIENT_CLINIC_OR_DEPARTMENT_OTHER): Payer: Medicaid Other | Admitting: General Surgery

## 2023-05-12 DIAGNOSIS — L89314 Pressure ulcer of right buttock, stage 4: Secondary | ICD-10-CM | POA: Diagnosis not present

## 2023-05-12 NOTE — Progress Notes (Signed)
HABEEB, CHAGOLLA ([id number]) [id number] Page 1 of 9 Visit Report for 05/12/2023 Arrival Information Details Patient Name: Date of Service: [name] RIO [name] 05/12/2023 9:30 A M Medical Record Number: [id number] Patient Account Number: [id number] Date of Birth/Sex: Treating RN: [birth year] (17 y.o. M) Primary Care Kaylena Pacifico: Edwina Barth Other Clinician: Referring Zayd Bonet: Treating Masami Plata/Extender: Eilene Ghazi in Treatment: 42 Visit Information History Since Last Visit Added or deleted any medications: No Patient Arrived: Wheel Chair Any new allergies or adverse reactions: No Arrival Time: 09:45 Had a fall or experienced change in No Accompanied By: family activities of daily living that may affect Transfer Assistance: None risk of falls: Patient Identification Verified: Yes Signs or symptoms of abuse/neglect since last visito No Secondary Verification Process Completed: Yes Hospitalized since last visit: No Patient Requires Transmission-Based Precautions: No Implantable device outside of the clinic excluding No Patient Has Alerts: No cellular tissue based products placed in the center since last visit: Pain Present Now: No Electronic Signature(s) Signed: 05/12/2023 10:38:04 AM By: Dayton Scrape Entered By: Dayton Scrape on 05/12/2023 06:45:58 -------------------------------------------------------------------------------- Clinic Level of Care Assessment Details Patient Name: Date of Service: [name] 05/12/2023 9:30 A M Medical Record Number: [id number] Patient Account Number: [id number] Date of Birth/Sex: Treating RN: 2005-12-08 (17 y.o. Tammy Sours Primary Care Verena Shawgo: Edwina Barth Other Clinician: Referring Rami Waddle: Treating Mileigh Tilley/Extender: Eilene Ghazi in Treatment: 94 Clinic Level of Care Assessment Items TOOL 4 Quantity Score X- 1 0 Use when only  an EandM is performed on FOLLOW-UP visit ASSESSMENTS - Nursing Assessment / Reassessment X- 1 10 Reassessment of Co-morbidities (includes updates in patient status) X- 1 5 Reassessment of Adherence to Treatment Plan ASSESSMENTS - Wound and Skin A ssessment / Reassessment X - Simple Wound Assessment / Reassessment - one wound 1 5 []  - 0 Complex Wound Assessment / Reassessment - multiple wounds []  - 0 Dermatologic / Skin Assessment (not related to wound area) ASSESSMENTS - Focused Assessment []  - 0 Circumferential Edema Measurements - multi extremities []  - 0 Nutritional Assessment / Counseling / Intervention []  - 0 Lower Extremity Assessment (monofilament, tuning fork, pulses) Uzzle, Kasen L ([id number]) [id number] Page 2 of 9 []  - 0 Peripheral Arterial Disease Assessment (using hand held doppler) ASSESSMENTS - Ostomy and/or Continence Assessment and Care []  - 0 Incontinence Assessment and Management []  - 0 Ostomy Care Assessment and Management (repouching, etc.) PROCESS - Coordination of Care X - Simple Patient / Family Education for ongoing care 1 15 []  - 0 Complex (extensive) Patient / Family Education for ongoing care X- 1 10 Staff obtains Chiropractor, Records, T Results / Process Orders est []  - 0 Staff telephones HHA, Nursing Homes / Clarify orders / etc []  - 0 Routine Transfer to another Facility (non-emergent condition) []  - 0 Routine Hospital Admission (non-emergent condition) []  - 0 New Admissions / Manufacturing engineer / Ordering NPWT Apligraf, etc. , []  - 0 Emergency Hospital Admission (emergent condition) X- 1 10 Simple Discharge Coordination []  - 0 Complex (extensive) Discharge Coordination PROCESS - Special Needs []  - 0 Pediatric / Minor Patient Management []  - 0 Isolation Patient Management []  - 0 Hearing / Language / Visual special needs []  - 0 Assessment of Community assistance (transportation, D/C planning,  etc.) []  - 0 Additional assistance / Altered mentation []  - 0 Support Surface(s) Assessment (bed, cushion, seat, etc.) INTERVENTIONS - Wound Cleansing / Measurement X - Simple Wound Cleansing - one  wound 1 5 []  - 0 Complex Wound Cleansing - multiple wounds X- 1 5 Wound Imaging (photographs - any number of wounds) []  - 0 Wound Tracing (instead of photographs) X- 1 5 Simple Wound Measurement - one wound []  - 0 Complex Wound Measurement - multiple wounds INTERVENTIONS - Wound Dressings X - Small Wound Dressing one or multiple wounds 1 10 []  - 0 Medium Wound Dressing one or multiple wounds []  - 0 Large Wound Dressing one or multiple wounds []  - 0 Application of Medications - topical []  - 0 Application of Medications - injection INTERVENTIONS - Miscellaneous []  - 0 External ear exam []  - 0 Specimen Collection (cultures, biopsies, blood, body fluids, etc.) []  - 0 Specimen(s) / Culture(s) sent or taken to Lab for analysis []  - 0 Patient Transfer (multiple staff / Nurse, adult / Similar devices) []  - 0 Simple Staple / Suture removal (25 or less) []  - 0 Complex Staple / Suture removal (26 or more) []  - 0 Hypo / Hyperglycemic Management (close monitor of Blood Glucose) []  - 0 Ankle / Brachial Index (ABI) - do not check if billed separately SEHAJ, COLIN ([id number]) [phone].pdf Page 3 of 9 X- 1 5 Vital Signs Has the patient been seen at the hospital within the last three years: Yes Total Score: 85 Level Of Care: New/Established - Level 3 Electronic Signature(s) Signed: 05/12/2023 3:30:05 PM By: Shawn Stall RN, BSN Entered By: Shawn Stall on 05/12/2023 07:13:53 -------------------------------------------------------------------------------- Encounter Discharge Information Details Patient Name: Date of Service: [name], [name] 05/12/2023 9:30 A M Medical Record Number: [id number] Patient Account Number: [id number] Date of Birth/Sex:  Treating RN: January 03, 2006 (17 y.o. Tammy Sours Primary Care Trejuan Matherne: Edwina Barth Other Clinician: Referring Gracin Mcpartland: Treating Jyllian Haynie/Extender: Eilene Ghazi in Treatment: 13 Encounter Discharge Information Items Discharge Condition: Stable Ambulatory Status: Wheelchair Discharge Destination: Home Transportation: Private Auto Accompanied By: family Schedule Follow-up Appointment: Yes Clinical Summary of Care: Electronic Signature(s) Signed: 05/12/2023 3:30:05 PM By: Shawn Stall RN, BSN Entered By: Shawn Stall on 05/12/2023 07:14:38 -------------------------------------------------------------------------------- Lower Extremity Assessment Details Patient Name: Date of Service: [name] RIO N L. 05/12/2023 9:30 A M Medical Record Number: [id number] Patient Account Number: [id number] Date of Birth/Sex: Treating RN: 10-Feb-2006 (17 y.o. Tammy Sours Primary Care Salia Cangemi: Edwina Barth Other Clinician: Referring Zendayah Hardgrave: Treating Devonne Lalani/Extender: [name] Weeks in Treatment: 30 Electronic Signature(s) Signed: 05/12/2023 3:30:05 PM By: Shawn Stall RN, BSN Entered By: Shawn Stall on 05/12/2023 06:53:18 -------------------------------------------------------------------------------- Multi Wound Chart Details Patient Name: Date of Service: [name], [name] 05/12/2023 9:30 A M Medical Record Number: [id number] Patient Account Number: [id number] Date of Birth/Sex: Treating RN: 01-21-2006 (17 y.o. Trula Ore, [name] ([id number]) [id number] Page 4 of 9 Primary Care Makena Mcgrady: Edwina Barth Other Clinician: Referring Adileny Delon: Treating Mahealani Sulak/Extender: Eilene Ghazi in Treatment: 55 Vital Signs Height(in): 60 Pulse(bpm): 61 Weight(lbs): 110 Blood Pressure(mmHg): 106/52 Body Mass Index(BMI): 21.5 Temperature(F): 97.7 Respiratory  Rate(breaths/min): 18 [1:Photos:] [N/A:N/A] Right Ischium N/A N/A Wound Location: Pressure Injury N/A N/A Wounding Event: Pressure Ulcer N/A N/A Primary Etiology: Asthma, Paraplegia N/A N/A Comorbid History: 05/11/2022 N/A N/A Date Acquired: 1 N/A N/A Weeks of Treatment: Open N/A N/A Wound Status: No N/A N/A Wound Recurrence: 2.3x3.3x1 N/A N/A Measurements L x W x D (cm) 5.961 N/A N/A A (cm) : rea 5.961 N/A N/A Volume (cm) : 7.30% N/A N/A % Reduction in A rea: -85.40% N/A  N/A % Reduction in Volume: 12 Starting Position 1 (o'clock): 12 Ending Position 1 (o'clock): 2.5 Maximum Distance 1 (cm): Yes N/A N/A Undermining: Category/Stage IV N/A N/A Classification: Medium N/A N/A Exudate A Stark: Sanguinous N/A N/A Exudate Type: red N/A N/A Exudate Color: Well defined, not attached N/A N/A Wound Margin: Large (67-100%) N/A N/A Granulation A Stark: Red, Pink, Pale N/A N/A Granulation Quality: None Present (0%) N/A N/A Necrotic A Stark: Fat Layer (Subcutaneous Tissue): Yes N/A N/A Exposed Structures: Muscle: Yes Fascia: No Tendon: No Joint: No Bone: No None N/A N/A Epithelialization: No Abnormalities Noted N/A N/A Periwound Skin Texture: No Abnormalities Noted N/A N/A Periwound Skin Moisture: No Abnormalities Noted N/A N/A Periwound Skin Color: No Abnormality N/A N/A Temperature: Treatment Notes Wound #1 (Ischium) Wound Laterality: Right Cleanser Soap and Water Discharge Instruction: May shower and wash wound with dial antibacterial soap and water prior to dressing change. Vashe 5.8 (oz) Discharge Instruction: Cleanse the wound with Vashe prior to applying a clean dressing using gauze sponges, not tissue or cotton balls. Peri-Wound Care Skin Prep Discharge Instruction: Use skin prep as directed Topical Primary Dressing Promogran Prisma Matrix, 4.34 (sq in) (silver collagen) Discharge Instruction: Moisten collagen with saline or  hydrogel REVANTH, TRABUCCO ([id number]) [id number].pdf Page 5 of 9 Woven Gauze Sponge, Non-Sterile 4x4 (in/in) Discharge Instruction: moisten with saline and pack into wound, be sure to get into undermined areas Secondary Dressing Zetuvit Plus Silicone Border Dressing 5x5 (in/in) Discharge Instruction: Apply silicone border over primary dressing as directed. Secured With Compression Wrap Compression Stockings Facilities manager) Signed: 05/12/2023 10:51:24 AM By: Duanne Guess MD FACS Entered By: Duanne Guess on 05/12/2023 07:35:48 -------------------------------------------------------------------------------- Multi-Disciplinary Care Plan Details Patient Name: Date of Service: [name], [name] 05/12/2023 9:30 A M Medical Record Number: [id number] Patient Account Number: [id number] Date of Birth/Sex: Treating RN: [birth year] (17 y.o. Tammy Sours Primary Care Matalynn Graff: Edwina Barth Other Clinician: Referring Rex Magee: Treating Len Kluver/Extender: Eilene Ghazi in Treatment: 13 Multidisciplinary Care Plan reviewed with physician Active Inactive Pressure Nursing Diagnoses: Knowledge deficit related to causes and risk factors for pressure ulcer development Knowledge deficit related to management of pressures ulcers Potential for impaired tissue integrity related to pressure, friction, moisture, and shear Goals: Patient/caregiver will verbalize understanding of pressure ulcer management Date Initiated: 06/14/2022 Target Resolution Date: 07/15/2023 Goal Status: Active Interventions: Assess: immobility, friction, shearing, incontinence upon admission and as needed Assess offloading mechanisms upon admission and as needed Assess potential for pressure ulcer upon admission and as needed Treatment Activities: Patient referred for seating evaluation to ensure proper offloading : 06/14/2022 Pressure  reduction/relief device ordered : 06/14/2022 Notes: Wound/Skin Impairment Nursing Diagnoses: Impaired tissue integrity Knowledge deficit related to ulceration/compromised skin integrity Goals: Patient/caregiver will verbalize understanding of skin care regimen Date Initiated: 06/14/2022 Target Resolution Date: 07/15/2023 Goal Status: Active NAIROBI, PIPES ([id number]) [phone] Page 6 of 9 Ulcer/skin breakdown will have a volume reduction of 30% by week 4 Date Initiated: 06/14/2022 Date Inactivated: 07/15/2022 Target Resolution Date: 09/14/2022 Unmet Reason: requires new w/c Goal Status: Unmet cushion Ulcer/skin breakdown will have a volume reduction of 50% by week 8 Date Initiated: 07/15/2022 Date Inactivated: 09/16/2022 Target Resolution Date: 08/12/2022 Goal Status: Unmet Unmet Reason: infection Interventions: Assess patient/caregiver ability to obtain necessary supplies Assess patient/caregiver ability to perform ulcer/skin care regimen upon admission and as needed Assess ulceration(s) every visit Provide education on ulcer and skin care Treatment Activities: Skin care regimen initiated : 06/14/2022 Topical wound  management initiated : 06/14/2022 Notes: Electronic Signature(s) Signed: 05/12/2023 3:30:05 PM By: Shawn Stall RN, BSN Entered By: Shawn Stall on 05/12/2023 06:54:12 -------------------------------------------------------------------------------- Pain Assessment Details Patient Name: Date of Service: [name], [name] 05/12/2023 9:30 A M Medical Record Number: [id number] Patient Account Number: [id number] Date of Birth/Sex: Treating RN: 08/06/2005 (17 y.o. M) Primary Care Amreen Raczkowski: Edwina Barth Other Clinician: Referring Jere Bostrom: Treating Rochele Lueck/Extender: [name] Weeks in Treatment: 22 Active Problems Location of Pain Severity and Description of Pain Patient Has Paino No Site Locations Pain  Management and Medication Current Pain Management: Electronic Signature(s) Signed: 05/12/2023 10:38:04 AM By: Dayton Scrape Entered By: Dayton Scrape on 05/12/2023 06:46:41 [name] ([id number]) [id number] Page 7 of 9 -------------------------------------------------------------------------------- Patient/Caregiver Education Details Patient Name: Date of Service: [name] 12/26/2024andnbsp9:30 A M Medical Record Number: [id number] Patient Account Number: [id number] Date of Birth/Gender: Treating RN: 30-May-2005 (17 y.o. Tammy Sours Primary Care Physician: Edwina Barth Other Clinician: Referring Physician: Treating Physician/Extender: Eilene Ghazi in Treatment: 22 Education Assessment Education Provided To: Patient Education Topics Provided Pressure: Handouts: Pressure Injury: Prevention and Offloading Methods: Explain/Verbal Responses: Reinforcements needed Electronic Signature(s) Signed: 05/12/2023 3:30:05 PM By: Shawn Stall RN, BSN Entered By: Shawn Stall on 05/12/2023 06:54:23 -------------------------------------------------------------------------------- Wound Assessment Details Patient Name: Date of Service: [name], [name] 05/12/2023 9:30 A M Medical Record Number: [id number] Patient Account Number: [id number] Date of Birth/Sex: Treating RN: 08/29/2005 (17 y.o. M) Primary Care Deanglo Hissong: Edwina Barth Other Clinician: Referring Catrinia Racicot: Treating Shmiel Morton/Extender: [name] Weeks in Treatment: 66 Wound Status Wound Number: 1 Primary Etiology: Pressure Ulcer Wound Location: Right Ischium Wound Status: Open Wounding Event: Pressure Injury Comorbid History: Asthma, Paraplegia Date Acquired: 05/11/2022 Weeks Of Treatment: 47 Clustered Wound: No Photos Wound Measurements Length: (cm) 2.3 [place]um, Tandre L ([id number]) Width: (cm) Depth:  (cm) Area: (cm) Volume: (cm) % Reduction in Area: 7.3% [id number] Page 8 of 9 3.3 % Reduction in Volume: -85.4% 1 Epithelialization: None 5.961 Undermining: Yes 5.961 Starting Position (o'clock): 12 Ending Position (o'clock): 12 Maximum Distance: (cm) 2.5 Wound Description Classification: Category/Stage IV Wound Margin: Well defined, not attached Exudate Amount: Medium Exudate Type: Sanguinous Exudate Color: red Foul Odor After Cleansing: No Slough/Fibrino No Wound Bed Granulation Amount: Large (67-100%) Exposed Structure Granulation Quality: Red, Pink, Pale Fascia Exposed: No Necrotic Amount: None Present (0%) Fat Layer (Subcutaneous Tissue) Exposed: Yes Tendon Exposed: No Muscle Exposed: Yes Necrosis of Muscle: No Joint Exposed: No Bone Exposed: No Periwound Skin Texture Texture Color No Abnormalities Noted: Yes No Abnormalities Noted: Yes Moisture Temperature / Pain No Abnormalities Noted: Yes Temperature: No Abnormality Treatment Notes Wound #1 (Ischium) Wound Laterality: Right Cleanser Soap and Water Discharge Instruction: May shower and wash wound with dial antibacterial soap and water prior to dressing change. Vashe 5.8 (oz) Discharge Instruction: Cleanse the wound with Vashe prior to applying a clean dressing using gauze sponges, not tissue or cotton balls. Peri-Wound Care Skin Prep Discharge Instruction: Use skin prep as directed Topical Primary Dressing Promogran Prisma Matrix, 4.34 (sq in) (silver collagen) Discharge Instruction: Moisten collagen with saline or hydrogel Woven Gauze Sponge, Non-Sterile 4x4 (in/in) Discharge Instruction: moisten with saline and pack into wound, be sure to get into undermined areas Secondary Dressing Zetuvit Plus Silicone Border Dressing 5x5 (in/in) Discharge Instruction: Apply silicone border over primary dressing as directed. Secured With Compression Wrap Compression  Stockings Facilities manager) Signed: 05/12/2023 3:30:05 PM  By: Shawn Stall RN, BSN Entered By: Shawn Stall on 05/12/2023 07:03:10 [name] ([id number]) [id number] Page 9 of 9 -------------------------------------------------------------------------------- Vitals Details Patient Name: Date of Service: [name] 05/12/2023 9:30 A M Medical Record Number: [id number] Patient Account Number: [id number] Date of Birth/Sex: Treating RN: Apr 26, 2006 (17 y.o. M) Primary Care Naomee Nowland: Edwina Barth Other Clinician: Referring Anees Vanecek: Treating Jasime Westergren/Extender: Eilene Ghazi in Treatment: 29 Vital Signs Time Taken: 09:46 Temperature (F): 97.7 Height (in): 60 Pulse (bpm): 61 Weight (lbs): 110 Respiratory Rate (breaths/min): 18 Body Mass Index (BMI): 21.5 Blood Pressure (mmHg): 106/52 Reference Range: 80 - 120 mg / dl Electronic Signature(s) Signed: 05/12/2023 10:38:04 AM By: Dayton Scrape Entered By: Dayton Scrape on 05/12/2023 06:46:34

## 2023-05-12 NOTE — Progress Notes (Signed)
[name] ([id number]) [id number] Page 1 of 9 Visit Report for 05/12/2023 Chief Complaint Document Details Patient Name: Date of Service: [name] 05/12/2023 9:30 A M Medical Record Number: [id number] Patient Account Number: [id number] Date of Birth/Sex: Treating RN: 08/27/2005 (17 y.o. M) Primary Care Provider: Edwina Stark Other Clinician: Referring Provider: Treating Provider/Extender: [name] in Treatment: 28 Information Obtained from: Patient Chief Complaint Patient is at the clinic for treatment of an open pressure ulcer Electronic Signature(s) Signed: 05/12/2023 10:51:24 AM By: Cory Guess MD FACS Entered By: [name] on 05/12/2023 10:35:55 -------------------------------------------------------------------------------- HPI Details Patient Name: Date of Service: [name], Cory RIO N L. 05/12/2023 9:30 A M Medical Record Number: [id number] Patient Account Number: [id number] Date of Birth/Sex: Treating RN: [birth year] (17 y.o. 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 Around Christmas time, he developed a pressure ulcer on his right ischium. This seems to be related to the cushioning in his wheelchair. They have an appointment coming up on Wednesday to have this checked and addressed. For some reason he has been prescribed doxycycline and Flagyl and has a couple days left of this. They have just been covering the site with gauze. On his right ischial area, he has an oval wound that extends into the fat layer. There is some slough and fibrinous exudate present on the surface. There is  no erythema, induration, malodor, or purulent drainage to suggest infection. 06/21/2022: The wound measured larger and deeper today. There is evidence of pressure induced tissue injury and the surface is a bit dry with fibrinous exudate accumulation. He does not yet have a new cushion for his wheelchair. 06/30/2022: His wound is deeper again. The surface is very clean. They did obtain the eggcrate cushion, but did not cut out an area to offload his ulcer. 07/07/2022: His wound measured a little bit smaller today. There is slough on the wound surface. For some reason they did not get the Iodosorb gel and have been using Iodosorb pads; I do not think these are doing as good a job of chemical debridement as the gel would. They did cut out the space in his eggcrate foam cushion to accommodate his wound and I think this is helpful. 07/15/2022: His wound is deeper today. The tissue is pale, but the wound is fairly clean. For reasons that remain unclear, his wheelchair cushion has not been replaced and his caregivers have not contacted [employer] to do anything about it. 07/20/2022: His wound is a little bit smaller. There is still a lot of undermining. He has more slough accumulation today. We ordered him a Roho cushion last week, but he has not yet received it. 3/13; patient presents for follow-up. He has been using Hydrofera Blue to the right ischium wound bed. He has no issues or complaints today. 08/05/2022: The color of the tissue in the wound bed has improved markedly. It is much more beefy and red, whereas previously it has been quite pale. There is some slough on the wound surface. He finally got his custom molded wheelchair cushion for his school chair and a Roho cushion for home. There was an error on the part of the DME company for his low-air-loss mattress bed, but this is  in the process of being resolved. [name] ([id number]) [id number] Page 2 of 9 08/26/2022: The  wound is measuring a little deeper; it looks like some fat simply separated in the center of his wound, rather than worsening of the pressure injury. The quality of the tissue continues to improve. His low-air-loss mattress was finally delivered. He has his wound VAC with him for application today. 09/02/2022: The wound continues to worsen. I can palpate his trochanter in the deepest part of the wound, although the bone is not exposed. There is a strong odor coming from the wound and the tissue surface is gray. 09/09/2022: The progression of the wound seems to have been arrested. The color is markedly improved and is now beefy red. There are still some areas of the tissue that are more purpleish, suggesting ongoing pressure induced injury. The odor has abated. The culture that I took produced a polymicrobial population including Proteus, Pseudomonas, and multiple other species. He is currently taking levofloxacin and metronidazole. 09/16/2022: The wound continues to improve. The color and is red and the surface is appropriately moist. He has been in his Roho cushion and I do not see any areas of further pressure induced injury. He is completing his course of oral levofloxacin and Flagyl. 09/23/2022: No pressure induced tissue damage this week. There is no odor coming from the wound. No significant changes to the wound dimensions, however. 09/29/2022: The wound size remains about the same. The tissues are looking a bit healthier. There is slough on the wound surface. 10/07/2022: The wound is looking better. The undermining at 12:00 has closed down. The tunneling that extends over the trochanter is still present, but is tighter and narrower. The tissues look healthier. There is an odor, however, on the dressing. 10/14/2022: There has been very nice improvement in the wound over the past week. The tunneling of the trochanter is shorter by a good half centimeter and the tissue is much tighter. Near 12:00 the wound  is nearly flush with the surrounding skin surface. The odor has resolved. 10/25/2022: He had his wound VAC off for graduation and there has been some breakdown over the trochanter. The bone is not yet exposed, but the tissue is thinner. There is a layer of slough on the wound surface. 11/03/2022: There has been nice improvement since our last visit. The tissue over the trochanter has filled again and the space is contracting. The granulation tissue appears healthy and there is no slough. No evidence of ongoing pressure-induced tissue injury. 11/09/2022: The wound continues to improve. The undermined area is filling in more, but the tissue over the trochanter is still quite thin. There is minimal biofilm on the surface and no evidence of pressure induced tissue injury today. 11/16/2022: The undermined portion of the wound has filled in even more and the tissue over the trochanter is starting to get a bit thicker. Everything is extremely clean today without any odor or slough accumulation. 11/22/2022: The undermined area of the wound continues to contract. The wound is very clean. The tissue is robust and beefy red. 12/14/2022: The undermined area has come in by almost a cm. The tissue over the trochanter feels thicker. The wound is clean without any odor. 01/03/2023: The wound is stable, but there is some purpleish discoloration directly over the ischium consistent with pressure in this area. He has a little bit of slough accumulation around the edges of the wound. There is also an odor to the wound, despite use of  topical gentamicin and mupirocin. 01/19/2023: The wound has filled in and the cavity is not as deep. The area overlying the trochanter is closing in. I do not appreciate any pressure-related tissue injury. There is some slough on the surface. The odor has abated. He is currently still taking the Augmentin I prescribed in response to his culture data. The x- ray that was done was concerning for possible  osteomyelitis and he has had an MRI, but this has not yet been read. His albumin is normal. The referral to plastic surgery is on hold while we complete this testing and I suspect he will require treatment for osteomyelitis before they will agree to see him. 02/03/2023: The wound continues to contract. No pressure-related tissue injury. No odor. He saw infectious disease yesterday for osteomyelitis and will receive Dalvance infusions on a weekly basis. He does have an appointment to see plastics at [facility] coming up on the 26th of this month. 02/16/2023: There is senescent skin that has built up around the wound edges and some biofilm on the surface. The area overlying the trochanter has gotten more closed in and tighter. The external wound dimensions are about the same. He has completed his Dalvance treatment. When he saw plastic surgery last week, they recommended pressure mapping and intervention with different offloading including a new seat cushion, a reclining wheelchair to help a lot of the ischium and to decrease the amount of time he spends in his wheelchair. He was advised to spend most of his time lying in bed. He was advised to offload the area completely for 3 months and allow the [facility] more time to work. 03/01/2023: The wound is extremely clean today and is getting shallower with less undermining. The area over the trochanter is quite a bit tighter. There is no evidence of pressure induced tissue injury. 03/16/2023: I can no longer get a finger over the top of his trochanter. The tissue remains pink and viable although it does tend towards the dry side, slightly. 03/30/2023: The undermined area continues to contract, but there has been deterioration at the center of the wound directly overlying the ischium. Bone is not frankly exposed yet, but I am concerned this may occur if there is further deterioration. There is also a small tunnel that seems to be just separation of the muscle layers at  about the 12 o'clock position of the wound. It is difficult to find with a skinny probe. There is no malodor or purulent drainage. 04/12/2023: Fortunately, the wound seems to have rebounded from the last visit. Bone is better covered at the ischial site. The tunnel that was identified last week could not be appreciated. The tissue has closed and even more securely over the top of his trochanter. No malodor or purulent drainage. 12/9; per description of our intake nurse things have continued to deteriorate over the last 2 weeks. He arrives in clinic today with the [facility] canister full of sanguinous drainage. He had a bleeding area superiorly in the wound that we are able to get control of. Also concerning is the minimal amount of tissue over the ischial tuberosity itself. They informed us today that his Roho cushion in his wheelchair is no longer working and its relatively new 05/12/2023: Since the last time I saw this wound, it appears to have worsened. The space over the greater trochanter is larger and I can fit my finger into the gap. The tissue is pale and not particularly robust-looking. Electronic Signature(s) Signed: 05/12/2023 10:51:24 AM  By: Cory Guess MD FACS Entered By: [name] on 05/12/2023 10:37:34 Physical Exam Details -------------------------------------------------------------------------------- [name] ([id number]) [id number] Page 3 of 9 Patient Name: Date of Service: [name] 05/12/2023 9:30 A M Medical Record Number: [id number] Patient Account Number: [id number] Date of Birth/Sex: Treating RN: 07-Oct-2005 (17 y.o. M) Primary Care Provider: Edwina Stark Other Clinician: Referring Provider: Treating Provider/Extender: [name] Weeks in Treatment: 42 Constitutional . . . . no acute distress. Respiratory Normal work of breathing on room air.. Notes 05/12/2023: Since the last time I  saw this wound, it appears to have worsened. The space over the greater trochanter is larger and I can fit my finger into the gap. The tissue is pale and not particularly robust-looking. Electronic Signature(s) Signed: 05/12/2023 10:51:24 AM By: Cory Guess MD FACS Entered By: [name] on 05/12/2023 10:40:12 -------------------------------------------------------------------------------- Physician Orders Details Patient Name: Date of Service: [name], Cory RIO N L. 05/12/2023 9:30 A M Medical Record Number: [id number] Patient Account Number: [id number] Date of Birth/Sex: Treating RN: 05/19/2005 (17 y.o. 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 - Dr. Lady Gary RM 1 (front office to schedule) Return A Other: - Family to call [facility] plastics- wound has stalled with wound vac, and request another appointment possible surgical intervention. Restart wound vac- family to change three times a week. In clinic today will apply prisma with wet to dry dressings. Anesthetic Wound #1 Right Ischium (In clinic) Topical Lidocaine 4% applied to wound bed Bathing/ Shower/ Hygiene May shower and wash wound with soap and water. Negative Presssure Wound Therapy Medela Wound Vac continuously at 112mm/hg - restart wound vac today at home. Black  Foam Off-Loading Low air-loss mattress (Group 2) - ordered through [employer] Roho cushion for wheelchair - sent referral to [employer] Turn and reposition every 2 hours - use arms to lift up off the wheelchair at least hourly while up in wheelchair, avoid sitting for more than one hour KYRYN, BONNICI L ([id number]) [phone].pdf Page 4 of 9 increments Additional Orders / Instructions Follow Nutritious Diet - 70- 100 gms of protein per day. add protein drink such as Premeir Protein 2 times per day. Use the Juven for added protein intake. Juven Shake 1-2 times daily. Wound Treatment Wound #1 - Ischium Wound Laterality: Right Cleanser: Soap and Water 1 x Per Day/30 Days Discharge Instructions: May shower and wash wound with dial antibacterial soap and water prior to dressing change. Cleanser: Vashe 5.8 (oz) 1 x Per Day/30 Days Discharge Instructions: Cleanse the wound with Vashe prior to applying a clean dressing using gauze sponges, not tissue or cotton balls. Peri-Wound Care: Skin Prep 1 x Per Day/30 Days Discharge Instructions: Use skin prep as directed Prim Dressing: Promogran Prisma Matrix, 4.34 (sq in) (silver collagen) 1 x Per Day/30 Days ary Discharge Instructions: Moisten collagen with saline or hydrogel Prim Dressing: Woven Gauze Sponge, Non-Sterile 4x4 (in/in) 1 x Per Day/30 Days ary Discharge Instructions: moisten with saline and pack  into wound, be sure to get into undermined areas Secondary Dressing: Zetuvit Plus Silicone Border Dressing 5x5 (in/in) 1 x Per Day/30 Days Discharge Instructions: Apply silicone border over primary dressing as directed. Electronic Signature(s) Signed: 05/12/2023 10:51:24 AM By: Cory Guess MD FACS Entered By: [name] on 05/12/2023 10:40:35 Prescription 05/12/2023 -------------------------------------------------------------------------------- [name] Cory Guess MD Patient Name:  Provider: [birth year] [id number] Date of Birth: NPI#: Judie Petit [id number] Sex: DEA #: [phone] [birth year] Phone #: License #: UPN: Patient Address: [address] Eligha Bridegroom [facility] [place], [place] [address] [address] [place], [place] [address] [phone] Allergies latex Provider's Orders Roho cushion for wheelchair - sent referral to [employer] Hand Signature: Date(s): Electronic Signature(s) Signed: 05/12/2023 10:51:24 AM By: Cory Guess MD FACS Entered By: [name] on 05/12/2023 10:40:35 [name] ([id number]) [phone].pdf Page 5 of 9 -------------------------------------------------------------------------------- Problem List Details Patient Name: Date of Service: [name] 05/12/2023 9:30 A M Medical Record Number: [id number] Patient Account Number: [id number] Date of Birth/Sex: Treating RN: Jul 23, 2005 (17 y.o. 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 05/12/2023 9:30 A M Medical Record Number: [id number] Patient Account Number: [id number] Date of Birth/Sex: Treating RN: 01/06/2006 (17 y.o. 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 Around Christmas time, he developed a pressure ulcer on his right ischium. This seems to be related to the cushioning in his wheelchair. They have an appointment coming up on Wednesday to have this checked and addressed. For some reason he has been prescribed doxycycline and Flagyl and has a couple days left of this. They have just been covering the site with gauze. On his right ischial area, he has an oval wound that extends into the fat layer. There is some slough and fibrinous exudate present on the surface. There is no erythema, induration, malodor, or purulent drainage to suggest infection. 06/21/2022: The wound measured larger and deeper today. There is evidence of pressure induced tissue injury and the surface is a bit dry with fibrinous exudate accumulation. He does not yet have a new cushion for his wheelchair. 06/30/2022: His wound is deeper again. The surface is very clean. They did obtain the eggcrate cushion, but did not cut out an area to offload his ulcer. 07/07/2022: His wound measured a little bit smaller today. There is slough on the wound surface. For some reason they did not get  the Iodosorb gel and have been using Iodosorb pads; I do not think these are doing as good a job of chemical  debridement as the gel would. They did cut out the space in his eggcrate foam cushion to accommodate his wound and I think this is helpful. 07/15/2022: His wound is deeper today. The tissue is pale, but the wound is fairly clean. For reasons that remain unclear, his wheelchair cushion has not been replaced and his caregivers have not contacted [employer] to do anything about it. 07/20/2022: His wound is a little bit smaller. There is still a lot of undermining. He has more slough accumulation today. We ordered him a Roho cushion last week, but he has not yet received it. 3/13; patient presents for follow-up. He has been using Hydrofera Blue to the right ischium wound bed. He has no issues or complaints today. 08/05/2022: The color of the tissue in the wound bed has improved markedly. It is much more beefy and red, whereas previously it has been quite pale. There is some slough on the wound surface. He finally got his custom molded wheelchair cushion for his school chair and a Roho cushion for home. There was an error on the part of the DME company for his low-air-loss mattress bed, but this is in the process of being resolved. 08/26/2022: The wound is measuring a little deeper; it looks like some fat simply separated in the center of his wound, rather than worsening of the pressure injury. The quality of the tissue continues to improve. His low-air-loss mattress was finally delivered. He has his wound VAC with him for application today. 09/02/2022: The wound continues to worsen. I can palpate his trochanter in the deepest part of the wound, although the bone is not exposed. There is a strong odor coming from the wound and the tissue surface is gray. 09/09/2022: The progression of the wound seems to have been arrested. The color is markedly improved and is now beefy red. There are still some areas of the tissue that are more purpleish, suggesting ongoing pressure induced injury. The odor has abated. The culture  that I took produced a polymicrobial population including Proteus, Pseudomonas, and multiple other species. He is currently taking levofloxacin and metronidazole. 09/16/2022: The wound continues to improve. The color and is red and the surface is appropriately moist. He has been in his Roho cushion and I do not see any areas of further pressure induced injury. He is completing his course of oral levofloxacin and Flagyl. 09/23/2022: No pressure induced tissue damage this week. There is no odor coming from the wound. No significant changes to the wound dimensions, however. 09/29/2022: The wound size remains about the same. The tissues are looking a bit healthier. There is slough on the wound surface. 10/07/2022: The wound is looking better. The undermining at 12:00 has closed down. The tunneling that extends over the trochanter is still present, but is tighter and narrower. The tissues look healthier. There is an odor, however, on the dressing. 10/14/2022: There has been very nice improvement in the wound over the past week. The tunneling of the trochanter is shorter by a good half centimeter and the tissue is much tighter. Near 12:00 the wound is nearly flush with the surrounding skin surface. The odor has resolved. 10/25/2022: He had his wound VAC off for graduation and there has been some breakdown over the trochanter. The bone is not yet exposed, but the tissue is thinner. There is a layer of slough on  the wound surface. 11/03/2022: There has been nice improvement since our last visit. The tissue over the trochanter has filled again and the space is contracting. The granulation tissue appears healthy and there is no slough. No evidence of ongoing pressure-induced tissue injury. 11/09/2022: The wound continues to improve. The undermined area is filling in more, but the tissue over the trochanter is still quite thin. There is minimal biofilm on the surface and no evidence of pressure induced tissue injury  today. 11/16/2022: The undermined portion of the wound has filled in even more and the tissue over the trochanter is starting to get a bit thicker. Everything is extremely clean today without any odor or slough accumulation. 11/22/2022: The undermined area of the wound continues to contract. The wound is very clean. The tissue is robust and beefy red. 12/14/2022: The undermined area has come in by almost a cm. The tissue over the trochanter feels thicker. The wound is clean without any odor. 01/03/2023: The wound is stable, but there is some purpleish discoloration directly over the ischium consistent with pressure in this area. He has a little bit of slough accumulation around the edges of the wound. There is also an odor to the wound, despite use of topical gentamicin and mupirocin. 01/19/2023: The wound has filled in and the cavity is not as deep. The area overlying the trochanter is closing in. I do not appreciate any pressure-related tissue injury. There is some slough on the surface. The odor has abated. He is currently still taking the Augmentin I prescribed in response to his culture data. The x- ray that was done was concerning for possible osteomyelitis and he has had an MRI, but this has not yet been read. His albumin is normal. The referral to plastic surgery is on hold while we complete this testing and I suspect he will require treatment for osteomyelitis before they will agree to see him. 02/03/2023: The wound continues to contract. No pressure-related tissue injury. No odor. He saw infectious disease yesterday for osteomyelitis and will receive Dalvance infusions on a weekly basis. He does have an appointment to see plastics at [facility] coming up on the 26th of this month. 02/16/2023: There is senescent skin that has built up around the wound edges and some biofilm on the surface. The area overlying the trochanter has gotten more closed in and tighter. The external wound dimensions are about the same.  He has completed his Dalvance treatment. When he saw plastic surgery last week, they recommended pressure mapping and intervention with different offloading including a new seat cushion, a reclining wheelchair to help a lot of the ischium and to decrease the amount of time he spends in his wheelchair. He was advised to spend most of his time lying in bed. He was advised to offload the area completely for 3 months and allow the [facility] more time to work. 03/01/2023: The wound is extremely clean today and is getting shallower with less undermining. The area over the trochanter is quite a bit tighter. There is no [name], [name] ([id number]) [phone].pdf Page 7 of 9 evidence of pressure induced tissue injury. 03/16/2023: I can no longer get a finger over the top of his trochanter. The tissue remains pink and viable although it does tend towards the dry side, slightly. 03/30/2023: The undermined area continues to contract, but there has been deterioration at the center of the wound directly overlying the ischium. Bone is not frankly exposed yet, but I am concerned this may occur  if there is further deterioration. There is also a small tunnel that seems to be just separation of the muscle layers at about the 12 o'clock position of the wound. It is difficult to find with a skinny probe. There is no malodor or purulent drainage. 04/12/2023: Fortunately, the wound seems to have rebounded from the last visit. Bone is better covered at the ischial site. The tunnel that was identified last week could not be appreciated. The tissue has closed and even more securely over the top of his trochanter. No malodor or purulent drainage. 12/9; per description of our intake nurse things have continued to deteriorate over the last 2 weeks. He arrives in clinic today with the [facility] canister full of sanguinous drainage. He had a bleeding area superiorly in the wound that we are able to get control of.  Also concerning is the minimal amount of tissue over the ischial tuberosity itself. They informed us today that his Roho cushion in his wheelchair is no longer working and its relatively new 05/12/2023: Since the last time I saw this wound, it appears to have worsened. The space over the greater trochanter is larger and I can fit my finger into the gap. The tissue is pale and not particularly robust-looking. Objective Constitutional no acute distress. Vitals Time Taken: 9:46 AM, Height: 60 in, Weight: 110 lbs, BMI: 21.5, Temperature: 97.7 F, Pulse: 61 bpm, Respiratory Rate: 18 breaths/min, Blood Pressure: 106/52 mmHg. Respiratory Normal work of breathing on room air.. General Notes: 05/12/2023: Since the last time I saw this wound, it appears to have worsened. The space over the greater trochanter is larger and I can fit my finger into the gap. The tissue is pale and not particularly robust-looking. Integumentary (Hair, Skin) Wound #1 status is Open. Original cause of wound was Pressure Injury. The date acquired was: 05/11/2022. The wound has been in treatment 47 weeks. The wound is located on the Right Ischium. The wound measures 2.3cm length x 3.3cm width x 1cm depth; 5.961cm^2 area and 5.961cm^3 volume. There is muscle and Fat Layer (Subcutaneous Tissue) exposed. There is undermining starting at 12:00 and ending at 12:00 with a maximum distance of 2.5cm. There is a medium amount of sanguinous drainage noted. The wound margin is well defined and not attached to the wound base. There is large (67-100%) red, pink, pale granulation within the wound bed. There is no necrotic tissue within the wound bed. The periwound skin appearance had no abnormalities noted for texture. The periwound skin appearance had no abnormalities noted for moisture. The periwound skin appearance had no abnormalities noted for color. Periwound temperature was noted as No Abnormality. 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 - Dr. Lady Gary RM 1 (front office to schedule) Other: - Family to call [facility] plastics- wound has stalled with wound vac, and request another appointment possible surgical intervention. Restart wound vac- family to change three times a week. In clinic today will apply prisma with wet to dry dressings. Anesthetic: Wound #1 Right Ischium: (In clinic) Topical Lidocaine 4% applied to wound bed Bathing/ Shower/ Hygiene: May shower and wash wound with soap and water. Negative Presssure Wound Therapy: Medela Wound Vac continuously at 164mm/hg - restart wound vac today at home. Black Foam Off-Loading: Low air-loss mattress (Group 2) - ordered  through [employer] Roho cushion for wheelchair - sent referral to [employer] [name], [name] ([id number]) [phone].pdf Page 8 of 9 Turn and reposition every 2 hours - use arms to lift up off the wheelchair at least hourly while up in wheelchair, avoid sitting for more than one hour increments Additional Orders / Instructions: Follow Nutritious Diet - 70- 100 gms of protein per day. add protein drink such as Premeir Protein 2 times per day. Use the Juven for added protein intake. Juven Shake 1-2 times daily. WOUND #1: - Ischium Wound Laterality: Right Cleanser: Soap and Water 1 x Per Day/30 Days Discharge Instructions: May shower and wash wound with dial antibacterial soap and water prior to dressing change. Cleanser: Vashe 5.8 (oz) 1 x Per Day/30 Days Discharge Instructions: Cleanse the wound with Vashe prior to applying a clean dressing using gauze sponges, not tissue or cotton balls. Peri-Wound Care: Skin  Prep 1 x Per Day/30 Days Discharge Instructions: Use skin prep as directed Prim Dressing: Promogran Prisma Matrix, 4.34 (sq in) (silver collagen) 1 x Per Day/30 Days ary Discharge Instructions: Moisten collagen with saline or hydrogel Prim Dressing: Woven Gauze Sponge, Non-Sterile 4x4 (in/in) 1 x Per Day/30 Days ary Discharge Instructions: moisten with saline and pack into wound, be sure to get into undermined areas Secondary Dressing: Zetuvit Plus Silicone Border Dressing 5x5 (in/in) 1 x Per Day/30 Days Discharge Instructions: Apply silicone border over primary dressing as directed. 05/12/2023: Since the last time I saw this wound, it appears to have worsened. The space over the greater trochanter is larger and I can fit my finger into the gap. The tissue is pale and not particularly robust-looking. There is still coverage over the bone, but quite thin. The wound VAC was discontinued last week, but I do not see any reason that it cannot be reinitiated we will go ahead and do that. I do think the wound has stalled despite all of our interventions. He did see plastic surgery at [facility] and they felt like the [facility] was doing a good job at that point; this is no longer the case. I have asked his grandmother to contact [facility] again for reevaluation and consideration of possible surgical intervention. Another issue is his wheelchair cushion. Apparently the Roho cushion failed and [employer] provided some sort of loaner cushion that is quite hard and frankly feels rough when compressed. The patient's grandfather found a Roho cushion on [employer] and they are going to go ahead and order that to try and offload better. In clinic today, as the patient did not have his wound VAC supplies with him, we applied Prisma silver collagen to the wound bed and then packed the cavity with saline moistened gauze. Follow-up in 2 weeks. Electronic Signature(s) Signed: 05/12/2023 10:51:24 AM By: Cory Guess MD FACS Entered By:  [name] on 05/12/2023 10:43:10 -------------------------------------------------------------------------------- SuperBill Details Patient Name: Date of Service: [name], Cory RIO N L. 05/12/2023 Medical Record Number: [id number] Patient Account Number: [id number] Date of Birth/Sex: Treating RN: 08-06-2005 (17 y.o. Tammy Sours Primary Care Provider: Edwina Stark Other Clinician: Referring Provider: Treating Provider/Extender: [name] Weeks in Treatment: 47 Diagnosis Coding ICD-10 Codes Code Description L89.314 Pressure ulcer of right buttock, stage 4 M86.152 Other acute osteomyelitis, left femur Q05.2 Lumbar spina bifida with hydrocephalus K59.2 Neurogenic bowel, not elsewhere classified N31.9 Neuromuscular dysfunction of bladder, unspecified J45.20 Mild intermittent asthma, uncomplicated Facility Procedures : CPT4 Code: [id number] Description: 99213 - WOUND CARE VISIT-LEV 3 EST PT Modifier:  Quantity: 1 Physician Procedures : CPT4 Code Description Modifier [id number] 99214 - WC PHYS LEVEL 4 - EST PT ICD-10 Diagnosis Description L89.314 Pressure ulcer of right buttock, stage 4 M86.152 Other acute osteomyelitis, left femur JAHAZIAH, CHEESEMAN ([id number])  [phone].pdf Page 9 o Q05.2 Lumbar spina bifida with hydrocephalus K59.2 Neurogenic bowel, not elsewhere classified Quantity: 1 f 9 Electronic Signature(s) Signed: 05/12/2023 10:51:24 AM By: Cory Guess MD FACS Entered By: [name] on 05/12/2023 10:44:18

## 2023-05-23 ENCOUNTER — Ambulatory Visit: Payer: Medicaid Other | Admitting: Emergency Medicine

## 2023-05-26 ENCOUNTER — Encounter (HOSPITAL_BASED_OUTPATIENT_CLINIC_OR_DEPARTMENT_OTHER): Payer: Medicaid Other | Attending: General Surgery | Admitting: General Surgery

## 2023-05-26 DIAGNOSIS — M86152 Other acute osteomyelitis, left femur: Secondary | ICD-10-CM | POA: Diagnosis not present

## 2023-05-26 DIAGNOSIS — Q052 Lumbar spina bifida with hydrocephalus: Secondary | ICD-10-CM | POA: Insufficient documentation

## 2023-05-26 DIAGNOSIS — K592 Neurogenic bowel, not elsewhere classified: Secondary | ICD-10-CM | POA: Diagnosis not present

## 2023-05-26 DIAGNOSIS — L89314 Pressure ulcer of right buttock, stage 4: Secondary | ICD-10-CM | POA: Diagnosis present

## 2023-05-26 DIAGNOSIS — G8220 Paraplegia, unspecified: Secondary | ICD-10-CM | POA: Insufficient documentation

## 2023-05-26 DIAGNOSIS — N319 Neuromuscular dysfunction of bladder, unspecified: Secondary | ICD-10-CM | POA: Insufficient documentation

## 2023-05-26 DIAGNOSIS — J4520 Mild intermittent asthma, uncomplicated: Secondary | ICD-10-CM | POA: Diagnosis not present

## 2023-05-26 NOTE — Progress Notes (Signed)
 Hoppe, Shabazz Stark ([id number]) [id number] Page 1 of 7 Visit Report for 05/26/2023 Arrival Information Details Patient Name: Date of Service: [name] [name]. 05/26/2023 10:30 A M Medical Record Number: [id number] Patient Account Number: [id number] Date of Birth/Sex: Treating RN: 2005-06-24 (18 y.o. M) Primary Care Cherrise Occhipinti: Purcell Rubin Other Clinician: Referring Neva Ramaswamy: Treating Suhail Peloquin/Extender: Marolyn Delon Purcell Rubin Devra in Treatment: 69 Visit Information History Since Last Visit Added or deleted any medications: No Patient Arrived: Wheel Chair Had a fall or experienced change in No Arrival Time: 10:04 activities of daily living that may affect Accompanied By: grandma risk of falls: Transfer Assistance: None Signs or symptoms of abuse/neglect since last visito No Patient Identification Verified: Yes Hospitalized since last visit: No Secondary Verification Process Completed: Yes Implantable device outside of the clinic excluding No Patient Requires Transmission-Based Precautions: No cellular tissue based products placed in the center Patient Has Alerts: No since last visit: Pain Present Now: No Electronic Signature(s) Signed: 05/26/2023 4:37:25 PM By: Okey Bonier Entered By: Okey Bonier on 05/26/2023 07:04:27 -------------------------------------------------------------------------------- Encounter Discharge Information Details Patient Name: Date of Service: [name], [name] 05/26/2023 10:30 A M Medical Record Number: [id number] Patient Account Number: [id number] Date of Birth/Sex: Treating RN: 10/23/2005 (18 y.o. NETTY Merleen Handing Primary Care Cayce Paschal: Purcell Rubin Other Clinician: Referring Duayne Brideau: Treating Aliz Meritt/Extender: Marolyn Delon Purcell Rubin Devra in Treatment: 24 Encounter Discharge Information Items Discharge Condition: Stable Ambulatory Status: Ambulatory Discharge Destination:  Home Transportation: Private Auto Accompanied By: grandma Schedule Follow-up Appointment: Yes Clinical Summary of Care: Patient Declined Electronic Signature(s) Signed: 05/26/2023 4:46:16 PM By: Merleen Handing RN, BSN Entered By: Boehlein, Linda on 05/26/2023 07:48:40 MILTA ZACHERY CROME ([id number]) [id number] Page 2 of 7 -------------------------------------------------------------------------------- Lower Extremity Assessment Details Patient Name: Date of Service: [name] Cory HAIG LOISE CROME. 05/26/2023 10:30 A M Medical Record Number: [id number] Patient Account Number: [id number] Date of Birth/Sex: Treating RN: 01-Dec-2005 (18 y.o. NETTY Merleen Handing Primary Care Izeah Vossler: Purcell Rubin Other Clinician: Referring Elzina Devera: Treating Shanisha Lech/Extender: Marolyn Delon Purcell Rubin Weeks in Treatment: 47 Electronic Signature(s) Signed: 05/26/2023 4:46:16 PM By: Merleen Handing RN, BSN Entered By: Merleen Handing on 05/26/2023 07:23:36 -------------------------------------------------------------------------------- Multi Wound Chart Details Patient Name: Date of Service: [name], [name] 05/26/2023 10:30 A M Medical Record Number: [id number] Patient Account Number: [id number] Date of Birth/Sex: Treating RN: 30-Jun-2005 (18 y.o. M) Primary Care Kenwood Rosiak: Purcell Rubin Other Clinician: Referring Brynnan Rodenbaugh: Treating Latoia Eyster/Extender: Marolyn Delon Purcell Rubin Weeks in Treatment: 10 Vital Signs Height(in): 60 Pulse(bpm): 75 Weight(lbs): 110 Blood Pressure(mmHg): 109/50 Body Mass Index(BMI): 21.5 Temperature(F): 97.6 Respiratory Rate(breaths/min): 20 [1:Photos:] [N/A:N/A] Right Ischium N/A N/A Wound Location: Pressure Injury N/A N/A Wounding Event: Pressure Ulcer N/A N/A Primary Etiology: Asthma, Paraplegia N/A N/A Comorbid History: 05/11/2022 N/A N/A Date Acquired: 45 N/A N/A Weeks of Treatment: Open N/A N/A Wound Status: No N/A  N/A Wound Recurrence: 2.5x3x1.4 N/A N/A Measurements Stark x W x D (cm) 5.89 N/A N/A A (cm) : rea 8.247 N/A N/A Volume (cm) : 8.40% N/A N/A % Reduction in A rea: -156.40% N/A N/A % Reduction in Volume: 12 Starting Position 1 (o'clock): 12 Ending Position 1 (o'clock): 3.9 Maximum Distance 1 (cm): Yes N/A N/A Undermining: Category/Stage IV N/A N/A Classification: Medium N/A N/A Exudate A mount: Serosanguineous N/A N/A Exudate Type: red, brown N/A N/A Exudate Color: Well defined, not attached N/A N/A Wound Margin: Large (67-100%) N/A N/A Granulation A mount: Red, Pink N/A N/A Granulation Quality:  Small (1-33%) N/A N/A Necrotic A mount: Fat Layer (Subcutaneous Tissue): Yes N/A N/A Exposed Structures: Muscle: Yes Fascia: No Tendon: No MORRIS, LONGENECKER ([id number]) [id number] Page 3 of 7 Joint: No Bone: No None N/A N/A Epithelialization: No Abnormalities Noted N/A N/A Periwound Skin Texture: No Abnormalities Noted N/A N/A Periwound Skin Moisture: No Abnormalities Noted N/A N/A Periwound Skin Color: No Abnormality N/A N/A Temperature: Negative Pressure Wound Therapy N/A N/A Procedures Performed: Maintenance (NPWT) Treatment Notes Electronic Signature(s) Signed: 05/26/2023 10:32:43 AM By: Marolyn Nest MD FACS Entered By: Marolyn Nest on 05/26/2023 07:32:43 -------------------------------------------------------------------------------- Multi-Disciplinary Care Plan Details Patient Name: Date of Service: DELENA RODRIGO HERO, [name] 05/26/2023 10:30 A M Medical Record Number: [id number] Patient Account Number: [id number] Date of Birth/Sex: Treating RN: 05-30-2005 (18 y.o. NETTY Merleen Handing Primary Care Ruslan Mccabe: Purcell Rubin Other Clinician: Referring Edgardo Petrenko: Treating Yogesh Cominsky/Extender: Marolyn Nest Purcell Rubin Devra in Treatment: 41 Multidisciplinary Care Plan reviewed with physician Active  Inactive Pressure Nursing Diagnoses: Knowledge deficit related to causes and risk factors for pressure ulcer development Knowledge deficit related to management of pressures ulcers Potential for impaired tissue integrity related to pressure, friction, moisture, and shear Goals: Patient/caregiver will verbalize understanding of pressure ulcer management Date Initiated: 06/14/2022 Target Resolution Date: 07/15/2023 Goal Status: Active Interventions: Assess: immobility, friction, shearing, incontinence upon admission and as needed Assess offloading mechanisms upon admission and as needed Assess potential for pressure ulcer upon admission and as needed Treatment Activities: Patient referred for seating evaluation to ensure proper offloading : 06/14/2022 Pressure reduction/relief device ordered : 06/14/2022 Notes: Wound/Skin Impairment Nursing Diagnoses: Impaired tissue integrity Knowledge deficit related to ulceration/compromised skin integrity Goals: Patient/caregiver will verbalize understanding of skin care regimen Date Initiated: 06/14/2022 Target Resolution Date: 07/15/2023 Goal Status: Active Ulcer/skin breakdown will have a volume reduction of 30% by week 4 Date Initiated: 06/14/2022 Date Inactivated: 07/15/2022 Target Resolution Date: 09/14/2022 Unmet Reason: requires new w/c Goal Status: Unmet cushion DAQUAN, CRAPPS ([id number]) [id number] Page 4 of 7 Ulcer/skin breakdown will have a volume reduction of 50% by week 8 Date Initiated: 07/15/2022 Date Inactivated: 09/16/2022 Target Resolution Date: 08/12/2022 Goal Status: Unmet Unmet Reason: infection Interventions: Assess patient/caregiver ability to obtain necessary supplies Assess patient/caregiver ability to perform ulcer/skin care regimen upon admission and as needed Assess ulceration(s) every visit Provide education on ulcer and skin care Treatment Activities: Skin care regimen initiated :  06/14/2022 Topical wound management initiated : 06/14/2022 Notes: Electronic Signature(s) Signed: 05/26/2023 4:46:16 PM By: Merleen Handing RN, BSN Entered By: Merleen Handing on 05/26/2023 07:23:57 -------------------------------------------------------------------------------- Negative Pressure Wound Therapy Maintenance (NPWT) Details Patient Name: Date of Service: DELENA RODRIGO HERO [name]. 05/26/2023 10:30 A M Medical Record Number: [id number] Patient Account Number: [id number] Date of Birth/Sex: Treating RN: January 29, 2006 (18 y.o. NETTY Merleen Handing Primary Care Tayli Buch: Purcell Rubin Other Clinician: Referring Ilay Capshaw: Treating Radhika Dershem/Extender: Marolyn Nest Purcell Rubin Weeks in Treatment: 49 NPWT Maintenance Performed for: Wound #1 Right Ischium Performed By: Merleen Handing, RN Type: Medela [employer] Coverage Size (sq cm): 7.5 Pressure Type: Constant Pressure Setting: 125 mmHG Drain Type: None Primary Contact: Other : silver collagen Sponge/Dressing Type: Foam- Black Date Initiated: 08/26/2022 Dressing Removed: Yes Quantity of Sponges/Gauze Removed: 1 Canister Changed: No Canister Exudate Volume: 100 Dressing Reapplied: Yes Quantity of Sponges/Gauze Inserted: 1 Respones T Treatment: o good Days On NPWT : 274 Post Procedure Diagnosis Same as Pre-procedure Electronic Signature(s) Signed: 05/26/2023 4:46:16 PM By: Merleen Handing RN, BSN Entered By: Boehlein, Linda on 05/26/2023 07:30:59 --------------------------------------------------------------------------------  Pain Assessment Details Patient Name: Date of Service: [name] Cory HAIG LOISE FREDRIK 05/26/2023 10:30 A M Medical Record Number: [id number] Patient Account Number: [id number] Date of Birth/Sex: Treating RN: Nov 29, 2005 (18 y.o. NETTY FEIL, Eliyohu Stark ([id number]) [id number] Page 5 of 7 Primary Care Blannie Shedlock: Purcell Rubin Other Clinician: Referring Marlei Glomski: Treating  Thersia Petraglia/Extender: Marolyn Delon Purcell Rubin Devra in Treatment: 90 Active Problems Location of Pain Severity and Description of Pain Patient Has Paino No Site Locations Pain Management and Medication Current Pain Management: Electronic Signature(s) Signed: 05/26/2023 4:37:25 PM By: Okey Bonier Entered By: Okey Bonier on 05/26/2023 07:04:50 -------------------------------------------------------------------------------- Patient/Caregiver Education Details Patient Name: Date of Service: [name] Cory RIO LOISE FREDRIK 1/9/2025andnbsp10:30 A M Medical Record Number: [id number] Patient Account Number: [id number] Date of Birth/Gender: Treating RN: Jan 18, 2006 (18 y.o. NETTY Merleen Handing Primary Care Physician: Purcell Rubin Other Clinician: Referring Physician: Treating Physician/Extender: Marolyn Delon Purcell Rubin Devra in Treatment: 21 Education Assessment Education Provided To: Patient Education Topics Provided Pressure: Methods: Explain/Verbal Responses: Reinforcements needed, State content correctly Wound/Skin Impairment: Methods: Explain/Verbal Responses: Reinforcements needed, State content correctly Electronic Signature(s) Signed: 05/26/2023 4:46:16 PM By: Merleen Handing RN, BSN Entered By: Boehlein, Linda on 05/26/2023 07:24:25 FEIL ZACHERY CROME ([id number]) [id number] Page 6 of 7 -------------------------------------------------------------------------------- Wound Assessment Details Patient Name: Date of Service: A RODRIGO Stark [name]. 05/26/2023 10:30 A M Medical Record Number: [id number] Patient Account Number: [id number] Date of Birth/Sex: Treating RN: 16-Oct-2005 (18 y.o. M) Primary Care Ayan Yankey: Purcell Rubin Other Clinician: Referring Reyanne Hussar: Treating Graci Hulce/Extender: Marolyn Delon Purcell Rubin Weeks in Treatment: 59 Wound Status Wound Number: 1 Primary Etiology: Pressure Ulcer Wound Location: Right  Ischium Wound Status: Open Wounding Event: Pressure Injury Comorbid History: Asthma, Paraplegia Date Acquired: 05/11/2022 Weeks Of Treatment: 49 Clustered Wound: No Photos Wound Measurements Length: (cm) 2.5 Width: (cm) 3 Depth: (cm) 1.4 Area: (cm) 5.89 Volume: (cm) 8.247 % Reduction in Area: 8.4% % Reduction in Volume: -156.4% Epithelialization: None Undermining: Yes Starting Position (o'clock): 12 Ending Position (o'clock): 12 Maximum Distance: (cm) 3.9 Wound Description Classification: Category/Stage IV Wound Margin: Well defined, not attached Exudate Amount: Medium Exudate Type: Serosanguineous Exudate Color: red, brown Foul Odor After Cleansing: No Slough/Fibrino No Wound Bed Granulation Amount: Large (67-100%) Exposed Structure Granulation Quality: Red, Pink Fascia Exposed: No Necrotic Amount: Small (1-33%) Fat Layer (Subcutaneous Tissue) Exposed: Yes Necrotic Quality: Adherent Slough Tendon Exposed: No Muscle Exposed: Yes Necrosis of Muscle: No Joint Exposed: No Bone Exposed: No Periwound Skin Texture Texture Color No Abnormalities Noted: Yes No Abnormalities Noted: Yes Moisture Temperature / Pain No Abnormalities Noted: Yes Temperature: No Abnormality Treatment Notes MIGUEL, CHRISTIANA ([id number]) [id number] Page 7 of 7 Wound #1 (Ischium) Wound Laterality: Right Cleanser Soap and Water Discharge Instruction: May shower and wash wound with dial antibacterial soap and water prior to dressing change. Vashe 5.8 (oz) Discharge Instruction: Cleanse the wound with Vashe prior to applying a clean dressing using gauze sponges, not tissue or cotton balls. Peri-Wound Care Skin Prep Discharge Instruction: Use skin prep as directed Topical Primary Dressing Promogran Prisma Matrix, 4.34 (sq in) (silver collagen) Discharge Instruction: Moisten collagen with saline or hydrogel NPWT Secondary Dressing Secured With Compression  Wrap Compression Stockings Add-Ons Electronic Signature(s) Signed: 05/26/2023 4:46:16 PM By: Boehlein, Linda RN, BSN Entered By: Boehlein, Linda on 05/26/2023 07:22:53 -------------------------------------------------------------------------------- Vitals Details Patient Name: Date of Service: [name], [name] 05/26/2023 10:30 A M Medical Record Number: [id number] Patient Account Number: [id number] Date of Birth/Sex: Treating  RN: 10-Aug-2005 (17 y.o. M) Primary Care Riona Lahti: Purcell Rubin Other Clinician: Referring Sharene Krikorian: Treating Niyana Chesbro/Extender: Marolyn Delon Purcell Rubin Devra in Treatment: 83 Vital Signs Time Taken: 10:04 Temperature (F): 97.6 Height (in): 60 Pulse (bpm): 75 Weight (lbs): 110 Respiratory Rate (breaths/min): 20 Body Mass Index (BMI): 21.5 Blood Pressure (mmHg): 109/50 Reference Range: 80 - 120 mg / dl Electronic Signature(s) Signed: 05/26/2023 4:37:25 PM By: Okey Bonier Entered By: Okey Bonier on 05/26/2023 07:04:45

## 2023-05-26 NOTE — Progress Notes (Addendum)
 Engert, Jaspreet L ([id number]) [id number] Page 1 of 9 Visit Report for 05/26/2023 Chief Complaint Document Details Patient Name: Date of Service: DELENA RODRIGO Cory PINKY RIO N L. 05/26/2023 10:30 A M Medical Record Number: [id number] Patient Account Number: [id number] Date of Birth/Sex: Treating RN: 2005/06/23 (18 y.o. M) Primary Care Provider: Purcell Rubin Other Clinician: Referring Provider: Treating Provider/Extender: Marolyn Delon Purcell Rubin Devra in Treatment: 40 Information Obtained from: Patient Chief Complaint Patient is at the clinic for treatment of an open pressure ulcer Electronic Signature(s) Signed: 05/26/2023 10:32:50 AM By: Marolyn Delon MD FACS Entered By: Marolyn Delon on 05/26/2023 10:32:50 -------------------------------------------------------------------------------- HPI Details Patient Name: Date of Service: DELENA RODRIGO Cory, [name] 05/26/2023 10:30 A M Medical Record Number: [id number] Patient Account Number: [id number] Date of Birth/Sex: Treating RN: January 28, 2006 (18 y.o. 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 in their spina bifida clinic. Around Christmas time, he developed a pressure ulcer on his right ischium. This seems to be related to the cushioning in his wheelchair. They have an appointment coming up on Wednesday to have this checked and addressed. For some reason he has been prescribed doxycycline  and Flagyl  and has a couple days left of this. They have just been covering the site with gauze. On his right ischial area, he has an oval wound that extends into the fat layer. There is some slough and fibrinous exudate present on the surface. There is  no erythema, induration, malodor, or purulent drainage to suggest infection. 06/21/2022: The wound measured larger and deeper today. There is evidence of pressure induced tissue injury and the surface is a bit dry with fibrinous exudate accumulation. He does not yet have a new cushion for his wheelchair. 06/30/2022: His wound is deeper again. The surface is very clean. They did obtain the eggcrate cushion, but did not cut out an area to offload his ulcer. 07/07/2022: His wound measured a little bit smaller today. There is slough on the wound surface. For some reason they did not get the Iodosorb gel and have been using Iodosorb pads; I do not think these are doing as good a job of chemical debridement as the gel would. They did cut out the space in his eggcrate foam cushion to accommodate his wound and I think this is helpful. 07/15/2022: His wound is deeper today. The tissue is pale, but the wound is fairly clean. For reasons that remain unclear, his wheelchair cushion has not been replaced and his caregivers have not contacted [employer] to do anything about it. 07/20/2022: His wound is a little bit smaller. There is still a lot of undermining. He has more slough accumulation today. We ordered him a Roho cushion last week, but he has not yet received it. 3/13; patient presents for follow-up. He has been using Hydrofera Blue to the right ischium wound bed. He has no issues or complaints today. 08/05/2022: The color of the tissue in the wound bed has improved markedly. It is much more beefy and red, whereas previously it has been quite pale. There is some slough on the wound surface. He finally got his custom molded wheelchair cushion for his school chair and a Roho cushion for home. There was an error on the part of the DME company for his low-air-loss mattress bed, but this is  in the process of being resolved. Menken, Gustin L ([id number]) [id number] Page 2 of 9 08/26/2022: The  wound is measuring a little deeper; it looks like some fat simply separated in the center of his wound, rather than worsening of the pressure injury. The quality of the tissue continues to improve. His low-air-loss mattress was finally delivered. He has his wound VAC with him for application today. 09/02/2022: The wound continues to worsen. I can palpate his trochanter in the deepest part of the wound, although the bone is not exposed. There is a strong odor coming from the wound and the tissue surface is gray. 09/09/2022: The progression of the wound seems to have been arrested. The color is markedly improved and is now beefy red. There are still some areas of the tissue that are more purpleish, suggesting ongoing pressure induced injury. The odor has abated. The culture that I took produced a polymicrobial population including Proteus, Pseudomonas, and multiple other species. He is currently taking levofloxacin and metronidazole . 09/16/2022: The wound continues to improve. The color and is red and the surface is appropriately moist. He has been in his Roho cushion and I do not see any areas of further pressure induced injury. He is completing his course of oral levofloxacin and Flagyl . 09/23/2022: No pressure induced tissue damage this week. There is no odor coming from the wound. No significant changes to the wound dimensions, however. 09/29/2022: The wound size remains about the same. The tissues are looking a bit healthier. There is slough on the wound surface. 10/07/2022: The wound is looking better. The undermining at 12:00 has closed down. The tunneling that extends over the trochanter is still present, but is tighter and narrower. The tissues look healthier. There is an odor, however, on the dressing. 10/14/2022: There has been very nice improvement in the wound over the past week. The tunneling of the trochanter is shorter by a good half centimeter and the tissue is much tighter. Near 12:00 the wound  is nearly flush with the surrounding skin surface. The odor has resolved. 10/25/2022: He had his wound VAC off for graduation and there has been some breakdown over the trochanter. The bone is not yet exposed, but the tissue is thinner. There is a layer of slough on the wound surface. 11/03/2022: There has been nice improvement since our last visit. The tissue over the trochanter has filled again and the space is contracting. The granulation tissue appears healthy and there is no slough. No evidence of ongoing pressure-induced tissue injury. 11/09/2022: The wound continues to improve. The undermined area is filling in more, but the tissue over the trochanter is still quite thin. There is minimal biofilm on the surface and no evidence of pressure induced tissue injury today. 11/16/2022: The undermined portion of the wound has filled in even more and the tissue over the trochanter is starting to get a bit thicker. Everything is extremely clean today without any odor or slough accumulation. 11/22/2022: The undermined area of the wound continues to contract. The wound is very clean. The tissue is robust and beefy red. 12/14/2022: The undermined area has come in by almost a cm. The tissue over the trochanter feels thicker. The wound is clean without any odor. 01/03/2023: The wound is stable, but there is some purpleish discoloration directly over the ischium consistent with pressure in this area. He has a little bit of slough accumulation around the edges of the wound. There is also an odor to the wound, despite use of  topical gentamicin and mupirocin. 01/19/2023: The wound has filled in and the cavity is not as deep. The area overlying the trochanter is closing in. I do not appreciate any pressure-related tissue injury. There is some slough on the surface. The odor has abated. He is currently still taking the Augmentin I prescribed in response to his culture data. The x- ray that was done was concerning for possible  osteomyelitis and he has had an MRI, but this has not yet been read. His albumin is normal. The referral to plastic surgery is on hold while we complete this testing and I suspect he will require treatment for osteomyelitis before they will agree to see him. 02/03/2023: The wound continues to contract. No pressure-related tissue injury. No odor. He saw infectious disease yesterday for osteomyelitis and will receive Dalvance  infusions on a weekly basis. He does have an appointment to see plastics at [facility] coming up on the 26th of this month. 02/16/2023: There is senescent skin that has built up around the wound edges and some biofilm on the surface. The area overlying the trochanter has gotten more closed in and tighter. The external wound dimensions are about the same. He has completed his Dalvance  treatment. When he saw plastic surgery last week, they recommended pressure mapping and intervention with different offloading including a new seat cushion, a reclining wheelchair to help a lot of the ischium and to decrease the amount of time he spends in his wheelchair. He was advised to spend most of his time lying in bed. He was advised to offload the area completely for 3 months and allow the [facility] more time to work. 03/01/2023: The wound is extremely clean today and is getting shallower with less undermining. The area over the trochanter is quite a bit tighter. There is no evidence of pressure induced tissue injury. 03/16/2023: I can no longer get a finger over the top of his trochanter. The tissue remains pink and viable although it does tend towards the dry side, slightly. 03/30/2023: The undermined area continues to contract, but there has been deterioration at the center of the wound directly overlying the ischium. Bone is not frankly exposed yet, but I am concerned this may occur if there is further deterioration. There is also a small tunnel that seems to be just separation of the muscle layers at  about the 12 o'clock position of the wound. It is difficult to find with a skinny probe. There is no malodor or purulent drainage. 04/12/2023: Fortunately, the wound seems to have rebounded from the last visit. Bone is better covered at the ischial site. The tunnel that was identified last week could not be appreciated. The tissue has closed and even more securely over the top of his trochanter. No malodor or purulent drainage. 12/9; per description of our intake nurse things have continued to deteriorate over the last 2 weeks. He arrives in clinic today with the [facility] canister full of sanguinous drainage. He had a bleeding area superiorly in the wound that we are able to get control of. Also concerning is the minimal amount of tissue over the ischial tuberosity itself. They informed us  today that his Roho cushion in his wheelchair is no longer working and its relatively new 05/12/2023: Since the last time I saw this wound, it appears to have worsened. The space over the greater trochanter is larger and I can fit my finger into the gap. The tissue is pale and not particularly robust-looking. 05/26/2023: The tissue looks much improved  since his last visit. The tissue is a healthy-looking beefy red. There is still quite a bit of undermining to the wound, which is nearly circumferential. I can still get my finger over the top of his trochanter. There is no malodor and very little evidence of pressure-induced deep tissue injury. They are still awaiting an appointment at [facility] to discuss plastic surgery intervention. Electronic Signature(s) Signed: 05/26/2023 10:34:49 AM By: Marolyn Nest MD FACS Entered By: Marolyn Nest on 05/26/2023 10:34:49 MILTA ZACHERY CROME ([id number]) [id number] Page 3 of 9 -------------------------------------------------------------------------------- Physical Exam Details Patient Name: Date of Service: DELENA RODRIGO Cory PINKY RIO N L. 05/26/2023 10:30 A  M Medical Record Number: [id number] Patient Account Number: [id number] Date of Birth/Sex: Treating RN: [birth year] (18 y.o. M) Primary Care Provider: Purcell Rubin Other Clinician: Referring Provider: Treating Provider/Extender: Marolyn Nest Purcell Rubin Weeks in Treatment: 83 Constitutional . . . . no acute distress. Respiratory Normal work of breathing on room air.. Notes 05/26/2023: The tissue looks much improved since his last visit. The tissue is a healthy-looking beefy red. There is still quite a bit of undermining to the wound, which is nearly circumferential. I can still get my finger over the top of his trochanter. There is no malodor and very little evidence of pressure-induced deep tissue injury. They are still awaiting an appointment at [facility] to discuss plastic surgery intervention. Electronic Signature(s) Signed: 05/26/2023 10:35:53 AM By: Marolyn Nest MD FACS Entered By: Marolyn Nest on 05/26/2023 10:35:53 -------------------------------------------------------------------------------- Physician Orders Details Patient Name: Date of Service: DELENA RODRIGO Cory, [name] 05/26/2023 10:30 A M Medical Record Number: [id number] Patient Account Number: [id number] Date of Birth/Sex: Treating RN: Feb 12, 2006 (17 y.o. 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 - Dr. Marolyn Return A Other: - Family to call [facility] plastics- wound has stalled with wound vac, and request another appointment possible surgical intervention. Anesthetic Wound #1 Right Ischium (In clinic) Topical Lidocaine  4% applied to wound bed Bathing/ Shower/ Hygiene May shower and wash wound with soap and water. Vermeer, Rubel L ([id number]) [id number] Page 4 of 9 Negative Presssure Wound Therapy Medela Wound Vac continuously at 135mm/hg Black Foam Off-Loading Low air-loss mattress (Group 2) - ordered through [employer] Roho cushion for wheelchair - sent referral to [employer] Turn and reposition every 2 hours - use arms to lift up off the wheelchair at least hourly while up in wheelchair, avoid sitting for more than one hour increments Additional Orders / Instructions Follow Nutritious Diet - 70- 100 gms of protein per day. add protein drink such as Premeir Protein 2 times per day. Use the Juven for added protein intake. Juven Shake 1-2 times daily. Wound Treatment Wound #1 - Ischium Wound Laterality: Right Cleanser: Soap and Water 1 x Per Day/30 Days Discharge Instructions: May shower and wash wound with dial antibacterial soap and water prior to dressing change. Cleanser: Vashe 5.8 (oz) 1 x Per Day/30 Days Discharge Instructions: Cleanse the wound with Vashe prior to applying a clean dressing using gauze  sponges, not tissue or cotton balls. Peri-Wound Care: Skin Prep 1 x Per Day/30 Days Discharge Instructions: Use skin prep as directed Prim Dressing: Promogran Prisma Matrix, 4.34 (sq in) (silver collagen) 1 x Per Day/30 Days ary Discharge Instructions: Moisten collagen with saline or hydrogel Prim Dressing: NPWT ary 1 x Per Day/30 Days Electronic Signature(s) Signed: 05/26/2023 11:05:31 AM By: Marolyn Nest MD FACS Entered By: Marolyn Nest on 05/26/2023 10:41:58 Prescription  05/26/2023 -------------------------------------------------------------------------------- Johannesen, Affan L. Marolyn Nest MD Patient Name: Provider: Feb 12, 2006 [id number] Date of Birth: NPI#: [id number] Sex: DEA #: [birth year] Phone #: License #: UPN: Patient Address: [address] [place] [facility] [place], [place] [address] [address] [place], [place] [address] [phone] Allergies latex Provider's Orders Roho cushion for wheelchair - sent referral to [employer] Hand Signature: Date(s): Electronic Signature(s) Signed: 05/26/2023 11:05:31 AM By: Marolyn Nest MD FACS Entered By: Marolyn Nest on 05/26/2023 10:41:58 MILTA ZACHERY CROME ([id number]) [phone].pdf Page 5 of 9 -------------------------------------------------------------------------------- Problem List Details Patient Name: Date of Service: DELENA RODRIGO Cory PINKY HAIG LOISE FREDRIK 05/26/2023 10:30 A M Medical Record Number: [id number] Patient Account Number: [id number] Date of Birth/Sex: Treating RN: 2006/01/19 (18 y.o. 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 05/26/2023 10:30 A M Medical Record Number: [id number] Patient Account Number: [id number] Date of Birth/Sex: Treating RN: 2005/08/21 (17 y.o. 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 Around Christmas time, he developed a pressure ulcer on his right ischium. This seems to be related to the cushioning in his wheelchair. They have an appointment coming up on Wednesday to have this checked and addressed. For some reason he has been prescribed doxycycline  and Flagyl  and has a couple days left of this. They have just been covering the site with gauze. On his right ischial area, he has an oval wound that extends into the fat layer. There is some slough and fibrinous exudate present on the surface. There is no erythema, induration, malodor, or purulent drainage to suggest infection. 06/21/2022: The wound measured larger and deeper today. There is evidence of pressure induced tissue injury and the surface is a bit dry with fibrinous exudate accumulation. He does not yet have a new cushion for his wheelchair. 06/30/2022: His wound is deeper again. The surface is very clean. They did obtain the eggcrate cushion, but did not cut out an area to offload his ulcer. 07/07/2022:  His wound measured a little bit smaller today. There is slough on the wound  surface. For some reason they did not get the Iodosorb gel and have been using Iodosorb pads; I do not think these are doing as good a job of chemical debridement as the gel would. They did cut out the space in his eggcrate foam cushion to accommodate his wound and I think this is helpful. 07/15/2022: His wound is deeper today. The tissue is pale, but the wound is fairly clean. For reasons that remain unclear, his wheelchair cushion has not been replaced and his caregivers have not contacted [employer] to do anything about it. 07/20/2022: His wound is a little bit smaller. There is still a lot of undermining. He has more slough accumulation today. We ordered him a Roho cushion last week, but he has not yet received it. 3/13; patient presents for follow-up. He has been using Hydrofera Blue to the right ischium wound bed. He has no issues or complaints today. 08/05/2022: The color of the tissue in the wound bed has improved markedly. It is much more beefy and red, whereas previously it has been quite pale. There is some slough on the wound surface. He finally got his custom molded wheelchair cushion for his school chair and a Roho cushion for home. There was an error on the part of the DME company for his low-air-loss mattress bed, but this is in the process of being resolved. 08/26/2022: The wound is measuring a little deeper; it looks like some fat simply separated in the center of his wound, rather than worsening of the pressure injury. The quality of the tissue continues to improve. His low-air-loss mattress was finally delivered. He has his wound VAC with him for application today. 09/02/2022: The wound continues to worsen. I can palpate his trochanter in the deepest part of the wound, although the bone is not exposed. There is a strong odor coming from the wound and the tissue surface is gray. 09/09/2022: The progression of the wound seems to have been arrested. The color is markedly improved and is now  beefy red. There are still some areas of the tissue that are more purpleish, suggesting ongoing pressure induced injury. The odor has abated. The culture that I took produced a polymicrobial population including Proteus, Pseudomonas, and multiple other species. He is currently taking levofloxacin and metronidazole . 09/16/2022: The wound continues to improve. The color and is red and the surface is appropriately moist. He has been in his Roho cushion and I do not see any areas of further pressure induced injury. He is completing his course of oral levofloxacin and Flagyl . 09/23/2022: No pressure induced tissue damage this week. There is no odor coming from the wound. No significant changes to the wound dimensions, however. 09/29/2022: The wound size remains about the same. The tissues are looking a bit healthier. There is slough on the wound surface. 10/07/2022: The wound is looking better. The undermining at 12:00 has closed down. The tunneling that extends over the trochanter is still present, but is tighter and narrower. The tissues look healthier. There is an odor, however, on the dressing. 10/14/2022: There has been very nice improvement in the wound over the past week. The tunneling of the trochanter is shorter by a good half centimeter and the tissue is much tighter. Near 12:00 the wound is nearly flush with the surrounding skin surface. The odor has resolved. 10/25/2022: He had his wound VAC off for graduation and there has been some  breakdown over the trochanter. The bone is not yet exposed, but the tissue is thinner. There is a layer of slough on the wound surface. 11/03/2022: There has been nice improvement since our last visit. The tissue over the trochanter has filled again and the space is contracting. The granulation tissue appears healthy and there is no slough. No evidence of ongoing pressure-induced tissue injury. 11/09/2022: The wound continues to improve. The undermined area is filling in  more, but the tissue over the trochanter is still quite thin. There is minimal biofilm on the surface and no evidence of pressure induced tissue injury today. 11/16/2022: The undermined portion of the wound has filled in even more and the tissue over the trochanter is starting to get a bit thicker. Everything is extremely clean today without any odor or slough accumulation. 11/22/2022: The undermined area of the wound continues to contract. The wound is very clean. The tissue is robust and beefy red. 12/14/2022: The undermined area has come in by almost a cm. The tissue over the trochanter feels thicker. The wound is clean without any odor. 01/03/2023: The wound is stable, but there is some purpleish discoloration directly over the ischium consistent with pressure in this area. He has a little bit of slough accumulation around the edges of the wound. There is also an odor to the wound, despite use of topical gentamicin and mupirocin. 01/19/2023: The wound has filled in and the cavity is not as deep. The area overlying the trochanter is closing in. I do not appreciate any pressure-related tissue injury. There is some slough on the surface. The odor has abated. He is currently still taking the Augmentin I prescribed in response to his culture data. The x- ray that was done was concerning for possible osteomyelitis and he has had an MRI, but this has not yet been read. His albumin is normal. The referral to plastic surgery is on hold while we complete this testing and I suspect he will require treatment for osteomyelitis before they will agree to see him. 02/03/2023: The wound continues to contract. No pressure-related tissue injury. No odor. He saw infectious disease yesterday for osteomyelitis and will receive Dalvance  infusions on a weekly basis. He does have an appointment to see plastics at [facility] coming up on the 26th of this month. 02/16/2023: There is senescent skin that has built up around the wound edges and  some biofilm on the surface. The area overlying the trochanter has gotten more closed in and tighter. The external wound dimensions are about the same. He has completed his Dalvance  treatment. When he saw plastic surgery last week, they recommended pressure mapping and intervention with different offloading including a new seat cushion, a reclining wheelchair to help a lot of the ischium and to decrease the amount of time he spends in his wheelchair. He was advised to spend most of his time lying in bed. He was advised to offload the area completely for 3 months and allow the [facility] more time to work. 03/01/2023: The wound is extremely clean today and is getting shallower with less undermining. The area over the trochanter is quite a bit tighter. There is no DAGON, BUDAI ([id number]) [phone].pdf Page 7 of 9 evidence of pressure induced tissue injury. 03/16/2023: I can no longer get a finger over the top of his trochanter. The tissue remains pink and viable although it does tend towards the dry side, slightly. 03/30/2023: The undermined area continues to contract, but there has been deterioration at  the center of the wound directly overlying the ischium. Bone is not frankly exposed yet, but I am concerned this may occur if there is further deterioration. There is also a small tunnel that seems to be just separation of the muscle layers at about the 12 o'clock position of the wound. It is difficult to find with a skinny probe. There is no malodor or purulent drainage. 04/12/2023: Fortunately, the wound seems to have rebounded from the last visit. Bone is better covered at the ischial site. The tunnel that was identified last week could not be appreciated. The tissue has closed and even more securely over the top of his trochanter. No malodor or purulent drainage. 12/9; per description of our intake nurse things have continued to deteriorate over the last 2 weeks. He arrives  in clinic today with the [facility] canister full of sanguinous drainage. He had a bleeding area superiorly in the wound that we are able to get control of. Also concerning is the minimal amount of tissue over the ischial tuberosity itself. They informed us  today that his Roho cushion in his wheelchair is no longer working and its relatively new 05/12/2023: Since the last time I saw this wound, it appears to have worsened. The space over the greater trochanter is larger and I can fit my finger into the gap. The tissue is pale and not particularly robust-looking. 05/26/2023: The tissue looks much improved since his last visit. The tissue is a healthy-looking beefy red. There is still quite a bit of undermining to the wound, which is nearly circumferential. I can still get my finger over the top of his trochanter. There is no malodor and very little evidence of pressure-induced deep tissue injury. They are still awaiting an appointment at [facility] to discuss plastic surgery intervention. Objective Constitutional no acute distress. Vitals Time Taken: 10:04 AM, Height: 60 in, Weight: 110 lbs, BMI: 21.5, Temperature: 97.6 F, Pulse: 75 bpm, Respiratory Rate: 20 breaths/min, Blood Pressure: 109/50 mmHg. Respiratory Normal work of breathing on room air.. General Notes: 05/26/2023: The tissue looks much improved since his last visit. The tissue is a healthy-looking beefy red. There is still quite a bit of undermining to the wound, which is nearly circumferential. I can still get my finger over the top of his trochanter. There is no malodor and very little evidence of pressure- induced deep tissue injury. They are still awaiting an appointment at [facility] to discuss plastic surgery intervention. Integumentary (Hair, Skin) Wound #1 status is Open. Original cause of wound was Pressure Injury. The date acquired was: 05/11/2022. The wound has been in treatment 49 weeks. The wound is located on the Right Ischium. The wound measures  2.5cm length x 3cm width x 1.4cm depth; 5.89cm^2 area and 8.247cm^3 volume. There is muscle and Fat Layer (Subcutaneous Tissue) exposed. There is undermining starting at 12:00 and ending at 12:00 with a maximum distance of 3.9cm. There is a medium amount of serosanguineous drainage noted. The wound margin is well defined and not attached to the wound base. There is large (67-100%) red, pink granulation within the wound bed. There is a small (1-33%) amount of necrotic tissue within the wound bed including Adherent Slough. The periwound skin appearance had no abnormalities noted for texture. The periwound skin appearance had no abnormalities noted for moisture. The periwound skin appearance had no abnormalities noted for color. Periwound temperature was noted as No Abnormality. Assessment Active Problems ICD-10 Pressure ulcer of right buttock, stage 4 Other acute osteomyelitis, left femur  Lumbar spina bifida with hydrocephalus Neurogenic bowel, not elsewhere classified Neuromuscular dysfunction of bladder, unspecified Mild intermittent asthma, uncomplicated Plan Follow-up Appointments: Return Appointment in 2 weeks. - Dr. Marolyn Other: - Family to call [facility] plastics- wound has stalled with wound vac, and request another appointment possible surgical intervention. Anesthetic: Wound #1 Right Ischium: (In clinic) Topical Lidocaine  4% applied to wound bed Bathing/ Shower/ Hygiene: May shower and wash wound with soap and water. Negative Presssure Wound Therapy: Medela Wound Vac continuously at 125mm/hg [place], Kasir L ([id number]) [phone].pdf Page 8 of 9 Black Foam Off-Loading: Low air-loss mattress (Group 2) - ordered through [employer] Roho cushion for wheelchair - sent referral to [employer] Turn and reposition every 2 hours - use arms to lift up off the wheelchair at least hourly while up in wheelchair, avoid sitting for more than one hour  increments Additional Orders / Instructions: Follow Nutritious Diet - 70- 100 gms of protein per day. add protein drink such as Premeir Protein 2 times per day. Use the Juven for added protein intake. Juven Shake 1-2 times daily. WOUND #1: - Ischium Wound Laterality: Right Cleanser: Soap and Water 1 x Per Day/30 Days Discharge Instructions: May shower and wash wound with dial antibacterial soap and water prior to dressing change. Cleanser: Vashe 5.8 (oz) 1 x Per Day/30 Days Discharge Instructions: Cleanse the wound with Vashe prior to applying a clean dressing using gauze sponges, not tissue or cotton balls. Peri-Wound Care: Skin Prep 1 x Per Day/30 Days Discharge Instructions: Use skin prep as directed Prim Dressing: Promogran Prisma Matrix, 4.34 (sq in) (silver collagen) 1 x Per Day/30 Days ary Discharge Instructions: Moisten collagen with saline or hydrogel Prim Dressing: NPWT 1 x Per Day/30 Days ary 05/26/2023: The tissue looks much improved since his last visit. The tissue is a healthy-looking beefy red. There is still quite a bit of undermining to the wound, which is nearly circumferential. I can still get my finger over the top of his trochanter. There is no malodor and very little evidence of pressure-induced deep tissue injury. They are still awaiting an appointment at [facility] to discuss plastic surgery intervention. No debridement was necessary today. We will continue to use Prisma silver collagen on the surface with continued negative pressure wound therapy. His family did end up purchasing a new Roho cushion and I think this is part of the reason the wound looks better today. I am still interested in an opinion from plastic surgery, given the difficulties we are having with this wound. He will follow-up in 2 weeks. Electronic Signature(s) Signed: 05/26/2023 10:43:01 AM By: Marolyn Nest MD FACS Entered By: Marolyn Nest on 05/26/2023  10:43:01 -------------------------------------------------------------------------------- SuperBill Details Patient Name: Date of Service: DELENA RODRIGO HERO, [name] 05/26/2023 Medical Record Number: [id number] Patient Account Number: [id number] Date of Birth/Sex: Treating RN: Oct 14, 2005 (17 y.o. M) Primary Care Provider: Purcell Rubin Other Clinician: Referring Provider: Treating Provider/Extender: Marolyn Nest Purcell Rubin Weeks in Treatment: 49 Diagnosis Coding ICD-10 Codes Code Description L89.314 Pressure ulcer of right buttock, stage 4 M86.152 Other acute osteomyelitis, left femur Q05.2 Lumbar spina bifida with hydrocephalus K59.2 Neurogenic bowel, not elsewhere classified N31.9 Neuromuscular dysfunction of bladder, unspecified J45.20 Mild intermittent asthma, uncomplicated Facility Procedures : CPT4 Code: [id number] Description: 7605 - WOUND VAC-50 SQ CM OR LESS Modifier: Quantity: 1 Physician Procedures : CPT4 Code Description Modifier [id number] - WC PHYS LEVEL 4 - EST PT ICD-10 Diagnosis Description L89.314 Pressure ulcer of right buttock, stage 4  F13.847 Other acute osteomyelitis, left femur Q05.2 Lumbar spina bifida with hydrocephalus K59.2  Neurogenic bowel, not elsewhere classified Hallmark, Shadrick L ([id number]) [phone].pdf Page 9 Quantity: 1 of 9 Electronic Signature(s) Signed: 05/26/2023 10:45:47 AM By: Marolyn Nest MD FACS Entered By: Marolyn Nest on 05/26/2023 10:45:47

## 2023-05-31 ENCOUNTER — Encounter: Payer: Self-pay | Admitting: Emergency Medicine

## 2023-05-31 ENCOUNTER — Ambulatory Visit (INDEPENDENT_AMBULATORY_CARE_PROVIDER_SITE_OTHER): Payer: Medicaid Other | Admitting: Emergency Medicine

## 2023-05-31 VITALS — BP 118/72 | HR 100 | Temp 98.0°F | Ht <= 58 in | Wt 101.0 lb

## 2023-05-31 DIAGNOSIS — Q052 Lumbar spina bifida with hydrocephalus: Secondary | ICD-10-CM | POA: Diagnosis not present

## 2023-05-31 DIAGNOSIS — Q039 Congenital hydrocephalus, unspecified: Secondary | ICD-10-CM

## 2023-05-31 NOTE — Assessment & Plan Note (Signed)
 Clinically stable.  No complications. Wheelchair dependent Will greatly benefit from motorized wheelchair

## 2023-05-31 NOTE — Patient Instructions (Signed)
 Health Maintenance, Male  Adopting a healthy lifestyle and getting preventive care are important in promoting health and wellness. Ask your health care provider about:  The right schedule for you to have regular tests and exams.  Things you can do on your own to prevent diseases and keep yourself healthy.  What should I know about diet, weight, and exercise?  Eat a healthy diet    Eat a diet that includes plenty of vegetables, fruits, low-fat dairy products, and lean protein.  Do not eat a lot of foods that are high in solid fats, added sugars, or sodium.  Maintain a healthy weight  Body mass index (BMI) is a measurement that can be used to identify possible weight problems. It estimates body fat based on height and weight. Your health care provider can help determine your BMI and help you achieve or maintain a healthy weight.  Get regular exercise  Get regular exercise. This is one of the most important things you can do for your health. Most adults should:  Exercise for at least 150 minutes each week. The exercise should increase your heart rate and make you sweat (moderate-intensity exercise).  Do strengthening exercises at least twice a week. This is in addition to the moderate-intensity exercise.  Spend less time sitting. Even light physical activity can be beneficial.  Watch cholesterol and blood lipids  Have your blood tested for lipids and cholesterol at 20 years of age, then have this test every 5 years.  You may need to have your cholesterol levels checked more often if:  Your lipid or cholesterol levels are high.  You are older than 20 years of age.  You are at high risk for heart disease.  What should I know about cancer screening?  Many types of cancers can be detected early and may often be prevented. Depending on your health history and family history, you may need to have cancer screening at various ages. This may include screening for:  Colorectal cancer.  Prostate cancer.  Skin cancer.  Lung  cancer.  What should I know about heart disease, diabetes, and high blood pressure?  Blood pressure and heart disease  High blood pressure causes heart disease and increases the risk of stroke. This is more likely to develop in people who have high blood pressure readings or are overweight.  Talk with your health care provider about your target blood pressure readings.  Have your blood pressure checked:  Every 3-5 years if you are 20-52 years of age.  Every year if you are 3 years old or older.  If you are between the ages of 20 and 72 and are a current or former smoker, ask your health care provider if you should have a one-time screening for abdominal aortic aneurysm (AAA).  Diabetes  Have regular diabetes screenings. This checks your fasting blood sugar level. Have the screening done:  Once every three years after age 20 if you are at a normal weight and have a low risk for diabetes.  More often and at a younger age if you are overweight or have a high risk for diabetes.  What should I know about preventing infection?  Hepatitis B  If you have a higher risk for hepatitis B, you should be screened for this virus. Talk with your health care provider to find out if you are at risk for hepatitis B infection.  Hepatitis C  Blood testing is recommended for:  Everyone born from [birth year] through 1967.  Anyone  with known risk factors for hepatitis C.  Sexually transmitted infections (STIs)  You should be screened each year for STIs, including gonorrhea and chlamydia, if:  You are sexually active and are younger than 20 years of age.  You are older than 20 years of age and your health care provider tells you that you are at risk for this type of infection.  Your sexual activity has changed since you were last screened, and you are at increased risk for chlamydia or gonorrhea. Ask your health care provider if you are at risk.  Ask your health care provider about whether you are at high risk for HIV. Your health care provider  may recommend a prescription medicine to help prevent HIV infection. If you choose to take medicine to prevent HIV, you should first get tested for HIV. You should then be tested every 3 months for as long as you are taking the medicine.  Follow these instructions at home:  Alcohol use  Do not drink alcohol if your health care provider tells you not to drink.  If you drink alcohol:  Limit how much you have to 0-2 drinks a day.  Know how much alcohol is in your drink. In the [place] one drink equals one 12 oz bottle of beer (355 mL), one 5 oz glass of wine (148 mL), or one 1 oz glass of hard liquor (44 mL).  Lifestyle  Do not use any products that contain nicotine or tobacco. These products include cigarettes, chewing tobacco, and vaping devices, such as e-cigarettes. If you need help quitting, ask your health care provider.  Do not use street drugs.  Do not share needles.  Ask your health care provider for help if you need support or information about quitting drugs.  General instructions  Schedule regular health, dental, and eye exams.  Stay current with your vaccines.  Tell your health care provider if:  You often feel depressed.  You have ever been abused or do not feel safe at home.  Summary  Adopting a healthy lifestyle and getting preventive care are important in promoting health and wellness.  Follow your health care provider's instructions about healthy diet, exercising, and getting tested or screened for diseases.  Follow your health care provider's instructions on monitoring your cholesterol and blood pressure.  This information is not intended to replace advice given to you by your health care provider. Make sure you discuss any questions you have with your health care provider.  Document Revised: 09/22/2020 Document Reviewed: 09/22/2020  Elsevier Patient Education  2024 [employer].

## 2023-05-31 NOTE — Assessment & Plan Note (Signed)
Clinically stable.  No concerns.

## 2023-05-31 NOTE — Progress Notes (Signed)
 [name] 18 y.o.   Chief Complaint  Patient presents with   wheelchair assessment     Wanting an assessment for a mobile wheel chair     HISTORY OF PRESENT ILLNESS: This is a 18 y.o. male accompanied by father.  Here for assessment to get motorized wheelchair Patient has history of lumbar spina bifida and congenital hydrocephalus Completely wheelchair dependent.  Unable to use any other form of ambulation. Becoming more difficult and strenuous to handle regular wheelchair due to limited upper body strength.  HPI   Prior to Admission medications   Medication Sig Start Date End Date Taking? Authorizing Provider  albuterol  (PROVENTIL  HFA;VENTOLIN  HFA) 108 (90 Base) MCG/ACT inhaler Inhale 2 puffs into the lungs every 6 (six) hours as needed for shortness of breath. 07/27/07  Yes [provider]  albuterol  (PROVENTIL ) (2.5 MG/3ML) 0.083% nebulizer solution Take 2.5 mg by nebulization every 6 (six) hours as needed for wheezing or shortness of breath.   Yes [provider]  budesonide  (PULMICORT ) 0.5 MG/2ML nebulizer solution Take 0.5 mg by nebulization 3 (three) times daily as needed (shortness of breath).   Yes [provider]  budesonide -formoterol (SYMBICORT) 80-4.5 MCG/ACT inhaler Inhale 1 puff into the lungs daily as needed (shortness of breath/wheezing).   Yes [provider]  cetirizine (ZYRTEC) 10 MG tablet Take 10 mg by mouth daily as needed for allergies.  03/03/20  Yes [provider]  cholecalciferol (VITAMIN D3) 25 MCG (1000 UNIT) tablet Take 1 tablet (1,000 Units total) by mouth daily. 01/25/23 01/20/24 Yes Lamel Mccarley Jose, MD  clindamycin -benzoyl peroxide (BENZACLIN) gel Apply 1 application  topically daily. For acne 09/06/17  Yes [provider]  metroNIDAZOLE  (FLAGYL ) 500 MG tablet Take 1 tablet (500 mg total) by mouth 3 (three) times daily. DO NOT CONSUME ALCOHOL WHILE TAKING THIS MEDICATION. 06/05/22  Yes Christopher Savannah, PA-C  polyethylene glycol (MIRALAX  / GLYCOLAX ) 17 g packet Take 17 g by mouth every other day. 01/25/23  Yes Montel Vanderhoof Jose, MD  triamcinolone ointment (KENALOG) 0.5 % Apply 1 application topically every other day. 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) 05/02/2021   Pressure ulcer of other site, unstageable (HCC) 04/25/2020   History of pressure ulcer 04/23/2020   Pressure ulcer of toe of left foot 04/23/2020   Tinea pedis 04/23/2020   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   Vitamin D  deficiency 02/25/2020   Dietary counseling 02/25/2020   Scoliosis deformity of spine 08/03/2018   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   Precocious puberty 09/06/2017   Urinary incontinence without sensory awareness 06/23/2013   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   Neurogenic bladder 10/29/2010   Neurogenic bowel 10/29/2010   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   Lumbar spina bifida with hydrocephalus (HCC) 03/01/2005    Past Medical History:  Diagnosis Date   Spina bifida    Spina bifida, unspecified hydrocephalus presence, unspecified spinal region (HCC)    Stomatitis    Urinary tract infection  Past Surgical History:  Procedure Laterality Date   FOOT SURGERY     HIP SURGERY     MYRINGOTOMY     SHUNT EXTERNALIZATION     spina bifida     stigmatism repair      Social History   Socioeconomic History   Marital status: Single    Spouse name: Not on  file   Number of children: Not on file   Years of education: Not on file   Highest education level: Not on file  Occupational History   Not on file  Tobacco Use   Smoking status: Never   Smokeless tobacco: Never  Vaping Use   Vaping status: Never Used  Substance and Sexual Activity   Alcohol use: No   Drug use: Never   Sexual activity: Never  Other Topics Concern   Not on file  Social History Narrative   Lives with Parents, 2 siblings. No pets.   Social Drivers of Corporate Investment Banker Strain: Not on file  Food Insecurity: Not on file  Transportation Needs: Not on file  Physical Activity: Not on file  Stress: Not on file  Social Connections: Not on file  Intimate Partner Violence: Not on file    Family History  Adopted: Yes  Problem Relation Age of Onset   Multiple sclerosis Maternal Grandmother      Review of Systems  Constitutional: Negative.  Negative for chills and fever.  HENT: Negative.  Negative for congestion and sore throat.   Respiratory:  Negative for cough and shortness of breath.   Cardiovascular:  Negative for chest pain and palpitations.  Gastrointestinal:  Negative for abdominal pain, nausea and vomiting.  Skin: Negative.  Negative for rash.  Neurological:  Negative for dizziness and headaches.  All other systems reviewed and are negative.   Today's Vitals   05/31/23 0756  BP: 118/72  Pulse: 100  Temp: 98 F (36.7 C)  TempSrc: Oral  SpO2: 98%  Weight: 101 lb (45.8 kg)  Height: 4' 8 (1.422 m)   Body mass index is 22.64 kg/m.   Physical Exam Vitals reviewed.  Constitutional:      Appearance: Normal appearance.  HENT:     Head: Normocephalic.  Eyes:     Extraocular Movements: Extraocular movements intact.  Cardiovascular:     Rate and Rhythm: Normal rate.  Pulmonary:     Effort: Pulmonary effort is normal.  Skin:    General: Skin is warm and dry.  Neurological:     Mental Status: He is alert and oriented to person,  place, and time. Mental status is at baseline.  Psychiatric:        Behavior: Behavior normal.      ASSESSMENT & PLAN: A total of 32 minutes was spent with the patient and counseling/coordination of care regarding preparing for this visit, review of most recent office visit notes, review of multiple chronic medical conditions and their management, review of all medications, review of most recent bloodwork results, review of health maintenance items, education on nutrition, prognosis, documentation, and need for follow up.   Problem List Items Addressed This Visit       Nervous and Auditory   Lumbar spina bifida with hydrocephalus (HCC)   Clinically stable.  No complications. Wheelchair dependent Will greatly benefit from motorized wheelchair      Congenital hydrocephalus (HCC) - Primary   Clinically stable.  No concerns.      Patient Instructions  Health Maintenance, Male Adopting a  healthy lifestyle and getting preventive care are important in promoting health and wellness. Ask your health care provider about: The right schedule for you to have regular tests and exams. Things you can do on your own to prevent diseases and keep yourself healthy. What should I know about diet, weight, and exercise? Eat a healthy diet  Eat a diet that includes plenty of vegetables, fruits, low-fat dairy products, and lean protein. Do not eat a lot of foods that are high in solid fats, added sugars, or sodium. Maintain a healthy weight Body mass index (BMI) is a measurement that can be used to identify possible weight problems. It estimates body fat based on height and weight. Your health care provider can help determine your BMI and help you achieve or maintain a healthy weight. Get regular exercise Get regular exercise. This is one of the most important things you can do for your health. Most adults should: Exercise for at least 150 minutes each week. The exercise should increase your heart  rate and make you sweat (moderate-intensity exercise). Do strengthening exercises at least twice a week. This is in addition to the moderate-intensity exercise. Spend less time sitting. Even light physical activity can be beneficial. Watch cholesterol and blood lipids Have your blood tested for lipids and cholesterol at 20 years of age, then have this test every 5 years. You may need to have your cholesterol levels checked more often if: Your lipid or cholesterol levels are high. You are older than 20 years of age. You are at high risk for heart disease. What should I know about cancer screening? Many types of cancers can be detected early and may often be prevented. Depending on your health history and family history, you may need to have cancer screening at various ages. This may include screening for: Colorectal cancer. Prostate cancer. Skin cancer. Lung cancer. What should I know about heart disease, diabetes, and high blood pressure? Blood pressure and heart disease High blood pressure causes heart disease and increases the risk of stroke. This is more likely to develop in people who have high blood pressure readings or are overweight. Talk with your health care provider about your target blood pressure readings. Have your blood pressure checked: Every 3-5 years if you are 23-20 years of age. Every year if you are 55 years old or older. If you are between the ages of 44 and 90 and are a current or former smoker, ask your health care provider if you should have a one-time screening for abdominal aortic aneurysm (AAA). Diabetes Have regular diabetes screenings. This checks your fasting blood sugar level. Have the screening done: Once every three years after age 100 if you are at a normal weight and have a low risk for diabetes. More often and at a younger age if you are overweight or have a high risk for diabetes. What should I know about preventing infection? Hepatitis B If you have a  higher risk for hepatitis B, you should be screened for this virus. Talk with your health care provider to find out if you are at risk for hepatitis B infection. Hepatitis C Blood testing is recommended for: Everyone born from [birth year] through 1967. Anyone with known risk factors for hepatitis C. Sexually transmitted infections (STIs) You should be screened each year for STIs, including gonorrhea and chlamydia, if: You are sexually active and are younger than 20 years of age. You are older than 20 years of age and your health care provider tells  you that you are at risk for this type of infection. Your sexual activity has changed since you were last screened, and you are at increased risk for chlamydia or gonorrhea. Ask your health care provider if you are at risk. Ask your health care provider about whether you are at high risk for HIV. Your health care provider may recommend a prescription medicine to help prevent HIV infection. If you choose to take medicine to prevent HIV, you should first get tested for HIV. You should then be tested every 3 months for as long as you are taking the medicine. Follow these instructions at home: Alcohol use Do not drink alcohol if your health care provider tells you not to drink. If you drink alcohol: Limit how much you have to 0-2 drinks a day. Know how much alcohol is in your drink. In the [place] one drink equals one 12 oz bottle of beer (355 mL), one 5 oz glass of wine (148 mL), or one 1 oz glass of hard liquor (44 mL). Lifestyle Do not use any products that contain nicotine or tobacco. These products include cigarettes, chewing tobacco, and vaping devices, such as e-cigarettes. If you need help quitting, ask your health care provider. Do not use street drugs. Do not share needles. Ask your health care provider for help if you need support or information about quitting drugs. General instructions Schedule regular health, dental, and eye exams. Stay current  with your vaccines. Tell your health care provider if: You often feel depressed. You have ever been abused or do not feel safe at home. Summary Adopting a healthy lifestyle and getting preventive care are important in promoting health and wellness. Follow your health care provider's instructions about healthy diet, exercising, and getting tested or screened for diseases. Follow your health care provider's instructions on monitoring your cholesterol and blood pressure. This information is not intended to replace advice given to you by your health care provider. Make sure you discuss any questions you have with your health care provider. Document Revised: 09/22/2020 Document Reviewed: 09/22/2020 Elsevier Patient Education  2024 Elsevier Inc.     Emil Schaumann, MD [facility]

## 2023-06-09 ENCOUNTER — Encounter (HOSPITAL_BASED_OUTPATIENT_CLINIC_OR_DEPARTMENT_OTHER): Payer: Medicaid Other | Admitting: General Surgery

## 2023-06-09 DIAGNOSIS — L89314 Pressure ulcer of right buttock, stage 4: Secondary | ICD-10-CM | POA: Diagnosis not present

## 2023-06-23 ENCOUNTER — Encounter (HOSPITAL_BASED_OUTPATIENT_CLINIC_OR_DEPARTMENT_OTHER): Payer: Medicaid Other | Attending: General Surgery | Admitting: General Surgery

## 2023-06-23 DIAGNOSIS — N319 Neuromuscular dysfunction of bladder, unspecified: Secondary | ICD-10-CM | POA: Insufficient documentation

## 2023-06-23 DIAGNOSIS — M86152 Other acute osteomyelitis, left femur: Secondary | ICD-10-CM | POA: Insufficient documentation

## 2023-06-23 DIAGNOSIS — Z993 Dependence on wheelchair: Secondary | ICD-10-CM | POA: Diagnosis not present

## 2023-06-23 DIAGNOSIS — L89314 Pressure ulcer of right buttock, stage 4: Secondary | ICD-10-CM | POA: Diagnosis present

## 2023-06-23 DIAGNOSIS — Q052 Lumbar spina bifida with hydrocephalus: Secondary | ICD-10-CM | POA: Insufficient documentation

## 2023-06-23 DIAGNOSIS — J4520 Mild intermittent asthma, uncomplicated: Secondary | ICD-10-CM | POA: Insufficient documentation

## 2023-06-23 DIAGNOSIS — K592 Neurogenic bowel, not elsewhere classified: Secondary | ICD-10-CM | POA: Insufficient documentation

## 2023-07-07 ENCOUNTER — Ambulatory Visit (HOSPITAL_BASED_OUTPATIENT_CLINIC_OR_DEPARTMENT_OTHER): Payer: Medicaid Other | Admitting: General Surgery

## 2023-07-08 ENCOUNTER — Telehealth: Payer: Self-pay | Admitting: Emergency Medicine

## 2023-07-08 NOTE — Telephone Encounter (Signed)
Copied from CRM [id number]. Topic: General - Other >> Jul 08, 2023  9:42 AM Almira Coaster wrote: Reason for CRM: Cinthia from [employer] is calling to follow up on an order for a new power wheelchair that was faxed on 07/03/2023. Best call back number [phone].

## 2023-07-08 NOTE — Telephone Encounter (Signed)
Spoke with Cinthia from [employer] and she is re faxing forms. Only received part of what is needed to be filled out

## 2023-07-15 ENCOUNTER — Encounter (HOSPITAL_BASED_OUTPATIENT_CLINIC_OR_DEPARTMENT_OTHER): Payer: Medicaid Other | Admitting: General Surgery

## 2023-07-15 DIAGNOSIS — L89314 Pressure ulcer of right buttock, stage 4: Secondary | ICD-10-CM | POA: Diagnosis not present

## 2023-07-19 ENCOUNTER — Other Ambulatory Visit: Payer: Self-pay | Admitting: Emergency Medicine

## 2023-07-21 ENCOUNTER — Encounter (HOSPITAL_BASED_OUTPATIENT_CLINIC_OR_DEPARTMENT_OTHER): Payer: Medicaid Other | Attending: General Surgery | Admitting: Internal Medicine

## 2023-07-21 DIAGNOSIS — Q052 Lumbar spina bifida with hydrocephalus: Secondary | ICD-10-CM | POA: Insufficient documentation

## 2023-07-21 DIAGNOSIS — M86152 Other acute osteomyelitis, left femur: Secondary | ICD-10-CM | POA: Diagnosis not present

## 2023-07-21 DIAGNOSIS — N319 Neuromuscular dysfunction of bladder, unspecified: Secondary | ICD-10-CM | POA: Diagnosis not present

## 2023-07-21 DIAGNOSIS — L89323 Pressure ulcer of left buttock, stage 3: Secondary | ICD-10-CM | POA: Insufficient documentation

## 2023-07-21 DIAGNOSIS — L89314 Pressure ulcer of right buttock, stage 4: Secondary | ICD-10-CM | POA: Diagnosis present

## 2023-07-21 DIAGNOSIS — J4520 Mild intermittent asthma, uncomplicated: Secondary | ICD-10-CM | POA: Insufficient documentation

## 2023-07-21 DIAGNOSIS — K592 Neurogenic bowel, not elsewhere classified: Secondary | ICD-10-CM | POA: Insufficient documentation

## 2023-07-26 MED ORDER — POLYETHYLENE GLYCOL 3350 17 G PO PACK: 17.0000 g | PACK | ORAL | 0 refills | Status: DC

## 2023-07-28 ENCOUNTER — Encounter (HOSPITAL_BASED_OUTPATIENT_CLINIC_OR_DEPARTMENT_OTHER): Payer: Medicaid Other | Admitting: General Surgery

## 2023-08-04 ENCOUNTER — Encounter (HOSPITAL_BASED_OUTPATIENT_CLINIC_OR_DEPARTMENT_OTHER): Payer: Medicaid Other | Admitting: General Surgery

## 2023-08-04 DIAGNOSIS — L89314 Pressure ulcer of right buttock, stage 4: Secondary | ICD-10-CM | POA: Diagnosis not present

## 2023-08-12 ENCOUNTER — Telehealth: Payer: Self-pay | Admitting: Emergency Medicine

## 2023-08-12 NOTE — Telephone Encounter (Signed)
 Spoke with Brandi from [employer] and made her aware the information they are needing is from pt wound clinic. Form has been faxed to them on 3/26. Name and number was given to [facility] to follow up with this information

## 2023-08-12 NOTE — Telephone Encounter (Signed)
 Copied from CRM [id number]. Topic: General - Other >> Aug 11, 2023  2:47 PM Aisha D wrote: Reason for CRM: Marchelle Folks from [employer] stated that she faxed over documents to be filled out on faxed back on 08/04/23. Marchelle Folks stated that its requesting wound information and needs to be filled out and faxed back for insurance purposes.

## 2023-08-18 ENCOUNTER — Encounter (HOSPITAL_BASED_OUTPATIENT_CLINIC_OR_DEPARTMENT_OTHER): Attending: General Surgery | Admitting: General Surgery

## 2023-08-18 DIAGNOSIS — K592 Neurogenic bowel, not elsewhere classified: Secondary | ICD-10-CM | POA: Insufficient documentation

## 2023-08-18 DIAGNOSIS — N319 Neuromuscular dysfunction of bladder, unspecified: Secondary | ICD-10-CM | POA: Insufficient documentation

## 2023-08-18 DIAGNOSIS — Q052 Lumbar spina bifida with hydrocephalus: Secondary | ICD-10-CM | POA: Insufficient documentation

## 2023-08-18 DIAGNOSIS — Z09 Encounter for follow-up examination after completed treatment for conditions other than malignant neoplasm: Secondary | ICD-10-CM | POA: Insufficient documentation

## 2023-08-18 DIAGNOSIS — L89314 Pressure ulcer of right buttock, stage 4: Secondary | ICD-10-CM | POA: Insufficient documentation

## 2023-08-18 DIAGNOSIS — J4520 Mild intermittent asthma, uncomplicated: Secondary | ICD-10-CM | POA: Insufficient documentation

## 2023-08-18 DIAGNOSIS — M86152 Other acute osteomyelitis, left femur: Secondary | ICD-10-CM | POA: Insufficient documentation

## 2023-08-22 ENCOUNTER — Encounter (HOSPITAL_BASED_OUTPATIENT_CLINIC_OR_DEPARTMENT_OTHER): Admitting: General Surgery

## 2023-08-22 DIAGNOSIS — Z09 Encounter for follow-up examination after completed treatment for conditions other than malignant neoplasm: Secondary | ICD-10-CM | POA: Diagnosis not present

## 2023-08-22 DIAGNOSIS — N319 Neuromuscular dysfunction of bladder, unspecified: Secondary | ICD-10-CM | POA: Diagnosis not present

## 2023-08-22 DIAGNOSIS — J4520 Mild intermittent asthma, uncomplicated: Secondary | ICD-10-CM | POA: Diagnosis not present

## 2023-08-22 DIAGNOSIS — M86152 Other acute osteomyelitis, left femur: Secondary | ICD-10-CM | POA: Diagnosis not present

## 2023-08-22 DIAGNOSIS — Q052 Lumbar spina bifida with hydrocephalus: Secondary | ICD-10-CM | POA: Diagnosis not present

## 2023-08-22 DIAGNOSIS — L89314 Pressure ulcer of right buttock, stage 4: Secondary | ICD-10-CM | POA: Diagnosis present

## 2023-08-22 DIAGNOSIS — K592 Neurogenic bowel, not elsewhere classified: Secondary | ICD-10-CM | POA: Diagnosis not present

## 2023-09-05 ENCOUNTER — Encounter (HOSPITAL_BASED_OUTPATIENT_CLINIC_OR_DEPARTMENT_OTHER): Admitting: General Surgery

## 2023-09-05 DIAGNOSIS — Z09 Encounter for follow-up examination after completed treatment for conditions other than malignant neoplasm: Secondary | ICD-10-CM | POA: Diagnosis not present

## 2023-11-25 ENCOUNTER — Encounter: Payer: Self-pay | Admitting: Emergency Medicine

## 2023-12-05 NOTE — Telephone Encounter (Signed)
 Copied from CRM [id number]. Topic: Clinical - Prescription Issue >> Dec 05, 2023 12:49 PM Jasmin G wrote: Reason for CRM: Pt needs a refill on his usual medical supplies. Please call pt back ASAP.

## 2023-12-08 ENCOUNTER — Telehealth: Payer: Self-pay | Admitting: Emergency Medicine

## 2023-12-08 NOTE — Telephone Encounter (Signed)
 Patient dropped off document Handicap Placard, to be filled out by provider. Patient requested to send it back via Call Patient to pick up within 7-days. Document is located in providers tray at front office.Please advise at Mobile [phone] (mobile)

## 2023-12-08 NOTE — Telephone Encounter (Signed)
 Forms received placed in providers office to be signed

## 2023-12-09 ENCOUNTER — Encounter: Payer: Self-pay | Admitting: Radiology

## 2023-12-13 NOTE — Telephone Encounter (Signed)
 Copied from CRM #[id number]. Topic: General - Other >> Dec 13, 2023 12:51 PM Corin V wrote: Reason for CRM: Lang is faxing over catheter auth form and certificate of medical necessity to the office. If not received by tomorrow, please call back to have him refax these. CB# [phone]

## 2023-12-15 ENCOUNTER — Ambulatory Visit (INDEPENDENT_AMBULATORY_CARE_PROVIDER_SITE_OTHER): Admitting: Emergency Medicine

## 2023-12-15 ENCOUNTER — Other Ambulatory Visit (HOSPITAL_COMMUNITY): Payer: Self-pay

## 2023-12-15 VITALS — BP 96/76 | HR 103 | Temp 98.6°F | Ht 60.0 in | Wt 101.0 lb

## 2023-12-15 DIAGNOSIS — Z872 Personal history of diseases of the skin and subcutaneous tissue: Secondary | ICD-10-CM | POA: Diagnosis not present

## 2023-12-15 DIAGNOSIS — L89890 Pressure ulcer of other site, unstageable: Secondary | ICD-10-CM | POA: Diagnosis not present

## 2023-12-15 DIAGNOSIS — Q052 Lumbar spina bifida with hydrocephalus: Secondary | ICD-10-CM

## 2023-12-15 DIAGNOSIS — Q039 Congenital hydrocephalus, unspecified: Secondary | ICD-10-CM

## 2023-12-15 MED ORDER — POLYETHYLENE GLYCOL 3350 17 G PO PACK
17.0000 g | PACK | ORAL | 0 refills | Status: DC
  Filled 2023-12-15: qty 14, 28d supply, fill #0

## 2023-12-15 MED ORDER — POLYETHYLENE GLYCOL 3350 17 G PO PACK: 17.0000 g | PACK | ORAL | 0 refills | Status: AC

## 2023-12-15 NOTE — Assessment & Plan Note (Signed)
 Chronic intermittent problem Stable today. Will benefit from wound care center evaluation and chronic follow-up

## 2023-12-15 NOTE — Assessment & Plan Note (Signed)
Clinically stable.  No concerns.

## 2023-12-15 NOTE — Patient Instructions (Signed)
Preventing Pressure Injuries  A pressure injury, also called a pressure ulcer or bedsore, is an injury to the skin and the tissue under the skin that is caused by pressure. It can happen when your skin presses against a surface, such as a mattress or the seat of a wheelchair, for too long. The pressure on the blood vessels causes reduced blood flow to your skin. Over time, this can make the tissue die and break down, causing a wound. Pressure injuries often occur: Over bony parts of the body, such as the tailbone, shoulders, elbows, hips, heels, spine, ankles, and back of the head. Under medical devices, such as stockings, equipment to help with breathing, tubes, and splints. Inside the mouth or nose from dentures or tubes. How can pressure injuries affect me? Pressure injuries are caused by a lack of blood supply to an area of the skin. These injuries begin as a red or dark area on the skin and can become an open sore. They can come from a lot of pressure on the skin over a short period of time or from less pressure over a long period of time. Pressure injuries may be mild, moderate, or severe. They can cause pain, damage to your muscles, and infection. What can increase my risk for pressure injuries? You are more likely to develop this condition if: You are in the hospital or an extended care facility. You are bedridden or in a wheelchair. You have an injury or disease that keeps you from: Moving like normal. Feeling pain or pressure. Telling someone if you feel pain or pressure. You have a condition that: Makes you sleepy or less alert. Causes poor blood flow. You need to wear a medical device. You have poor control of your bladder or bowel movements (incontinence). You are not getting enough fluid or nutrients (malnutrition). You have had this condition before. What actions can I take to prevent pressure injuries? Reducing pressure Do not lie or sit in one position for a long time.  Move or change position as often as told by your health care provider. You may need to move: Every hour when out of bed in a chair. Every 2 hours when in bed. Use pillows or cushions to reduce pressure on certain areas of the body. Ask your health care provider to recommend a mattress, mattress cover, cushions, pads, or heel protectors. These may include products filled with air, foam, gel, or sand. Use medical devices that do not rub your skin. Tell your health care provider if one of your medical devices is causing pain or irritation. Skin care in the hospital If you are in the hospital, your health care providers will help you care for your skin. They may: Inspect your skin at least twice a day. This includes the areas under or around medical devices. Assess your nutrition and consult a dietitian if needed. Perform regular wound care. Help you move into a different position every few hours and adjust any medical devices. Keep your skin clean and dry. Use gentle cleansers and skin protectants if you are incontinent. Moisturize your dry skin. Skin care at home  Keep your skin clean and dry. Gently pat your skin dry. Do not rub or massage skin that is on bony areas. Moisturize dry skin. Use foams, creams, or powders to protect your skin from sweat, urine, and stool and reduce rubbing (friction) on the skin. Check your skin every day for any changes in color or any new blisters or sores. Check  under and around any medical devices and between skinfolds. Have a caregiver do this for you if you are not able. Nutrition  Drink enough fluid to keep your urine pale yellow. Eat a healthy diet that includes protein, vitamins, and minerals. Ask your health care provider what types of food you should eat. Lifestyle Do not use drugs or drink alcohol. Do not use any products that contain nicotine or tobacco. These products include cigarettes, chewing tobacco, and vaping devices, such as e-cigarettes. If  you need help quitting, ask your health care provider. Try to be active every day. Ask your health care provider what exercises or activities are safe for you. General instructions Take over-the-counter and prescription medicines only as told by your health care provider. Keep all follow-up visits. Work with your health care provider to manage any long-term (chronic) conditions. Where to find more information The National Pressure Injury Advisory Panel (NPIAP): npiap.com Contact a health care provider if: You feel or see any changes to your skin. This information is not intended to replace advice given to you by your health care provider. Make sure you discuss any questions you have with your health care provider. Document Revised: 10/02/2021 Document Reviewed: 10/02/2021 Elsevier Patient Education  2024 [employer].

## 2023-12-15 NOTE — Assessment & Plan Note (Signed)
 Clinically stable.  No complications. Wheelchair dependent

## 2023-12-15 NOTE — Progress Notes (Signed)
 Armistead LITTIE Feil 18 y.o.   Chief Complaint  Patient presents with   Medication Refill    Patient states he doesn't have any concerns to discuss. Patient Mom states they are changing the size of diapers to Medium from [employer]. States he needs the wounds on back of butt checked.     HISTORY OF PRESENT ILLNESS: This is a 18 y.o. male here for 27-month office follow-up and medication refill Has no complaints or medical concerns today.  HPI   Prior to Admission medications   Medication Sig Start Date End Date Taking? Authorizing Provider  albuterol  (PROVENTIL  HFA;VENTOLIN  HFA) 108 (90 Base) MCG/ACT inhaler Inhale 2 puffs into the lungs every 6 (six) hours as needed for shortness of breath. 07/27/07  Yes [provider]  albuterol  (PROVENTIL ) (2.5 MG/3ML) 0.083% nebulizer solution Take 2.5 mg by nebulization every 6 (six) hours as needed for wheezing or shortness of breath.   Yes [provider]  budesonide  (PULMICORT ) 0.5 MG/2ML nebulizer solution Take 0.5 mg by nebulization 3 (three) times daily as needed (shortness of breath).   Yes [provider]  budesonide -formoterol (SYMBICORT) 80-4.5 MCG/ACT inhaler Inhale 1 puff into the lungs daily as needed (shortness of breath/wheezing).   Yes [provider]  cetirizine (ZYRTEC) 10 MG tablet Take 10 mg by mouth daily as needed for allergies.  03/03/20  Yes [provider]  cholecalciferol (VITAMIN D3) 25 MCG (1000 UNIT) tablet Take 1 tablet (1,000 Units total) by mouth daily. 01/25/23 01/20/24 Yes Amorette Charrette Jose, MD  clindamycin -benzoyl peroxide (BENZACLIN) gel Apply 1 application  topically daily. For acne 09/06/17  Yes [provider]  polyethylene glycol (MIRALAX  / GLYCOLAX ) 17 g packet Take 17 g by mouth every other day. 07/26/23  Yes Rafay Dahan Jose, MD  triamcinolone ointment (KENALOG) 0.5 % Apply 1 application topically every other day. For eczema 03/25/20  Yes [provider]  metroNIDAZOLE  (FLAGYL ) 500 MG tablet Take 1 tablet (500 mg total) by mouth 3 (three) times daily. DO NOT CONSUME ALCOHOL WHILE TAKING THIS MEDICATION. 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) 05/02/2021   Pressure ulcer of other site, unstageable (HCC) 04/25/2020   History of pressure ulcer 04/23/2020   Pressure ulcer of toe of left foot 04/23/2020   Tinea pedis 04/23/2020   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   Vitamin D  deficiency 02/25/2020   Dietary counseling 02/25/2020   Scoliosis deformity of spine 08/03/2018   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   Precocious puberty 09/06/2017   Urinary incontinence without sensory awareness 06/23/2013   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   Neurogenic bladder 10/29/2010   Neurogenic bowel 10/29/2010   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   Lumbar spina bifida with hydrocephalus (HCC) 03/01/2005    Past Medical History:  Diagnosis Date   Spina bifida    Spina bifida, unspecified hydrocephalus presence, unspecified spinal region (HCC)    Stomatitis    Urinary tract infection     Past Surgical History:  Procedure Laterality  Date   FOOT SURGERY     HIP SURGERY     MYRINGOTOMY     SHUNT EXTERNALIZATION     spina bifida     stigmatism repair      Social History   Socioeconomic History   Marital status: Single    Spouse name: Not on file   Number of children: Not on file   Years of education: Not on file   Highest education  level: 12th grade  Occupational History   Not on file  Tobacco Use   Smoking status: Never   Smokeless tobacco: Never  Vaping Use   Vaping status: Never Used  Substance and Sexual Activity   Alcohol use: No   Drug use: Never   Sexual activity: Never  Other Topics Concern   Not on file  Social History Narrative   Lives with Parents, 2 siblings. No pets.   Social Drivers of Corporate investment banker Strain: Low Risk  (12/15/2023)   Overall Financial Resource Strain (CARDIA)    Difficulty of Paying Living Expenses: Not hard at all  Food Insecurity: No Food Insecurity (12/15/2023)   Hunger Vital Sign    Worried About Running Out of Food in the Last Year: Never true    Ran Out of Food in the Last Year: Never true  Transportation Needs: No Transportation Needs (12/15/2023)   PRAPARE - Administrator, Civil Service (Medical): No    Lack of Transportation (Non-Medical): No  Physical Activity: Inactive (12/15/2023)   Exercise Vital Sign    Days of Exercise per Week: 0 days    Minutes of Exercise per Session: Not on file  Stress: No Stress Concern Present (12/15/2023)   [employer] of Occupational Health - Occupational Stress Questionnaire    Feeling of Stress: Not at all  Social Connections: Moderately Isolated (12/15/2023)   Social Connection and Isolation Panel    Frequency of Communication with Friends and Family: More than three times a week    Frequency of Social Gatherings with Friends and Family: Three times a week    Attends Religious Services: More than 4 times per year    Active Member of Clubs or Organizations: No    Attends Engineer, structural: Not on file    Marital Status: Never married  Intimate Partner Violence: Not on file    Family History  Adopted: Yes  Problem Relation Age of Onset   Multiple sclerosis Maternal Grandmother      Review of Systems  Constitutional: Negative.  Negative for chills and fever.  HENT: Negative.   Negative for congestion and sore throat.   Respiratory: Negative.  Negative for cough and shortness of breath.   Cardiovascular: Negative.  Negative for chest pain and palpitations.  Gastrointestinal:  Negative for abdominal pain, diarrhea, nausea and vomiting.  Skin: Negative.  Negative for rash.  Neurological: Negative.  Negative for dizziness and headaches.  All other systems reviewed and are negative.   Vitals:   12/15/23 1506  BP: 96/76  Pulse: (!) 103  Temp: 98.6 F (37 C)  SpO2: 98%    Physical Exam Vitals reviewed.  HENT:     Head: Normocephalic.     Mouth/Throat:     Mouth: Mucous membranes are moist.     Pharynx: Oropharynx is clear.  Eyes:     Extraocular Movements: Extraocular movements intact.     Conjunctiva/sclera: Conjunctivae normal.     Pupils: Pupils are equal, round, and reactive  to light.  Cardiovascular:     Rate and Rhythm: Normal rate and regular rhythm.     Pulses: Normal pulses.     Heart sounds: Normal heart sounds.  Pulmonary:     Effort: Pulmonary effort is normal.     Breath sounds: Normal breath sounds.  Abdominal:     Palpations: Abdomen is soft.     Tenderness: There is no abdominal tenderness.  Musculoskeletal:     Cervical back: No tenderness.  Lymphadenopathy:     Cervical: No cervical adenopathy.  Skin:    General: Skin is warm and dry.  Neurological:     Mental Status: He is alert and oriented to person, place, and time.  Psychiatric:        Mood and Affect: Mood normal.        Behavior: Behavior normal.      ASSESSMENT & PLAN: A total of 35 minutes was spent with the patient and counseling/coordination of care regarding preparing for this visit, review of most recent office visit notes, review of multiple chronic medical conditions and their management, review of all medications, review of most recent bloodwork results, review of health maintenance items, education on nutrition, prognosis, documentation, and need for follow  up.   Problem List Items Addressed This Visit       Nervous and Auditory   Lumbar spina bifida with hydrocephalus (HCC)   Clinically stable.  No complications. Wheelchair dependent      Congenital hydrocephalus (HCC)   Clinically stable.  No concerns.        Other   History of pressure ulcer   Relevant Orders   AMB referral to wound care center   Pressure ulcer of other site, unstageable (HCC) - Primary   Chronic intermittent problem Stable today. Will benefit from wound care center evaluation and chronic follow-up      Relevant Orders   AMB referral to wound care center   Patient Instructions  Preventing Pressure Injuries  A pressure injury, also called a pressure ulcer or bedsore, is an injury to the skin and the tissue under the skin that is caused by pressure. It can happen when your skin presses against a surface, such as a mattress or the seat of a wheelchair, for too long. The pressure on the blood vessels causes reduced blood flow to your skin. Over time, this can make the tissue die and break down, causing a wound. Pressure injuries often occur: Over bony parts of the body, such as the tailbone, shoulders, elbows, hips, heels, spine, ankles, and back of the head. Under medical devices, such as stockings, equipment to help with breathing, tubes, and splints. Inside the mouth or nose from dentures or tubes. How can pressure injuries affect me? Pressure injuries are caused by a lack of blood supply to an area of the skin. These injuries begin as a red or dark area on the skin and can become an open sore. They can come from a lot of pressure on the skin over a short period of time or from less pressure over a long period of time. Pressure injuries may be mild, moderate, or severe. They can cause pain, damage to your muscles, and infection. What can increase my risk for pressure injuries? You are more likely to develop this condition if: You are in the hospital or an  extended care facility. You are bedridden or in a wheelchair. You have an injury or disease that keeps you from: Moving like normal. Feeling  pain or pressure. Telling someone if you feel pain or pressure. You have a condition that: Makes you sleepy or less alert. Causes poor blood flow. You need to wear a medical device. You have poor control of your bladder or bowel movements (incontinence). You are not getting enough fluid or nutrients (malnutrition). You have had this condition before. What actions can I take to prevent pressure injuries? Reducing pressure Do not lie or sit in one position for a long time. Move or change position as often as told by your health care provider. You may need to move: Every hour when out of bed in a chair. Every 2 hours when in bed. Use pillows or cushions to reduce pressure on certain areas of the body. Ask your health care provider to recommend a mattress, mattress cover, cushions, pads, or heel protectors. These may include products filled with air, foam, gel, or sand. Use medical devices that do not rub your skin. Tell your health care provider if one of your medical devices is causing pain or irritation. Skin care in the hospital If you are in the hospital, your health care providers will help you care for your skin. They may: Inspect your skin at least twice a day. This includes the areas under or around medical devices. Assess your nutrition and consult a dietitian if needed. Perform regular wound care. Help you move into a different position every few hours and adjust any medical devices. Keep your skin clean and dry. Use gentle cleansers and skin protectants if you are incontinent. Moisturize your dry skin. Skin care at home  Keep your skin clean and dry. Gently pat your skin dry. Do not rub or massage skin that is on bony areas. Moisturize dry skin. Use foams, creams, or powders to protect your skin from sweat, urine, and stool and reduce  rubbing (friction) on the skin. Check your skin every day for any changes in color or any new blisters or sores. Check under and around any medical devices and between skinfolds. Have a caregiver do this for you if you are not able. Nutrition  Drink enough fluid to keep your urine pale yellow. Eat a healthy diet that includes protein, vitamins, and minerals. Ask your health care provider what types of food you should eat. Lifestyle Do not use drugs or drink alcohol. Do not use any products that contain nicotine or tobacco. These products include cigarettes, chewing tobacco, and vaping devices, such as e-cigarettes. If you need help quitting, ask your health care provider. Try to be active every day. Ask your health care provider what exercises or activities are safe for you. General instructions Take over-the-counter and prescription medicines only as told by your health care provider. Keep all follow-up visits. Work with your health care provider to manage any long-term (chronic) conditions. Where to find more information The National Pressure Injury Advisory Panel (NPIAP): npiap.com Contact a health care provider if: You feel or see any changes to your skin. This information is not intended to replace advice given to you by your health care provider. Make sure you discuss any questions you have with your health care provider. Document Revised: 10/02/2021 Document Reviewed: 10/02/2021 Elsevier Patient Education  2024 Elsevier Inc.    Emil Schaumann, MD [facility]

## 2023-12-16 NOTE — Telephone Encounter (Signed)
 Forms received for CATH. Notes printed and forms will be faxed, as well with [employer] supplies forms

## 2024-02-15 ENCOUNTER — Telehealth: Payer: Self-pay

## 2024-02-15 NOTE — Telephone Encounter (Signed)
 Copied from CRM #[id number]. Topic: Referral - Request for Referral >> Feb 15, 2024 11:11 AM Suzen RAMAN wrote: Did the patient discuss referral with their provider in the last year? Yes (If No - schedule appointment) (If Yes - send message)  Appointment offered? No  Type of order/referral and detailed reason for visit: Occupational therapy, post surgery recommended (Dr. Cindie at [facility])  Preference of office, provider, location: WITHIN [place] OR [place]  If referral order, have you been seen by this specialty before? Yes (If Yes, this issue or another issue? When? Where?[facility], 03/12/24  Can we respond through MyChart? Yes

## 2024-02-17 ENCOUNTER — Other Ambulatory Visit: Payer: Self-pay | Admitting: Radiology

## 2024-02-17 DIAGNOSIS — Q052 Lumbar spina bifida with hydrocephalus: Secondary | ICD-10-CM

## 2024-02-17 NOTE — Telephone Encounter (Signed)
 Provide referral as requested.

## 2024-02-17 NOTE — Telephone Encounter (Signed)
 Referral placed for Closer location

## 2024-03-05 ENCOUNTER — Ambulatory Visit (HOSPITAL_COMMUNITY): Admitting: Occupational Therapy

## 2024-03-09 ENCOUNTER — Ambulatory Visit (HOSPITAL_COMMUNITY): Admitting: Occupational Therapy

## 2024-03-13 ENCOUNTER — Ambulatory Visit (HOSPITAL_COMMUNITY): Attending: Emergency Medicine | Admitting: Occupational Therapy

## 2024-03-13 ENCOUNTER — Encounter (HOSPITAL_COMMUNITY): Payer: Self-pay | Admitting: Occupational Therapy

## 2024-03-13 DIAGNOSIS — Q052 Lumbar spina bifida with hydrocephalus: Secondary | ICD-10-CM | POA: Insufficient documentation

## 2024-03-13 DIAGNOSIS — R29818 Other symptoms and signs involving the nervous system: Secondary | ICD-10-CM | POA: Insufficient documentation

## 2024-03-13 DIAGNOSIS — R278 Other lack of coordination: Secondary | ICD-10-CM | POA: Insufficient documentation

## 2024-03-13 NOTE — Therapy (Unsigned)
 OUTPATIENT OCCUPATIONAL THERAPY NEURO EVALUATION  Patient Name: [name] MRN: [id number] DOB:2005-10-14, 18 y.o., male Today's Date: 03/15/2024  PCP: Purcell Emil Schanz, MD REFERRING PROVIDER: Purcell Emil Schanz, MD  END OF SESSION:  OT End of Session - 03/15/24 1233     Visit Number 1    Number of Visits 1    Date for Recertification  03/14/24    Authorization Type [place] Medicaid    OT Start Time 1441    OT Stop Time 1526    OT Time Calculation (min) 45 min    Activity Tolerance Patient tolerated treatment well    Behavior During Therapy WFL for tasks assessed/performed          Past Medical History:  Diagnosis Date   Spina bifida    Spina bifida, unspecified hydrocephalus presence, unspecified spinal region (HCC)    Stomatitis    Urinary tract infection    Past Surgical History:  Procedure Laterality Date   FOOT SURGERY     HIP SURGERY     MYRINGOTOMY     SHUNT EXTERNALIZATION     spina bifida     stigmatism repair     Patient Active Problem List   Diagnosis Date Noted   Tinea versicolor 03/07/2023   History of recurrent UTI (urinary tract infection) 05/02/2021   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   Normal weight, pediatric, BMI 5th to 84th percentile for age 18/17/2022   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) 05/02/2021   Pressure ulcer of other site, unstageable (HCC) 04/25/2020   History of pressure ulcer 04/23/2020   Pressure ulcer of toe of left foot 04/23/2020   Tinea pedis 04/23/2020   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   Vitamin D  deficiency 02/25/2020   Scoliosis deformity of spine 08/03/2018   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   Precocious puberty 09/06/2017   Urinary incontinence without sensory awareness 06/23/2013   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   Neurogenic bladder 10/29/2010   Neurogenic bowel 10/29/2010   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   PRECAUTIONS: Fall  WEIGHT BEARING RESTRICTIONS: No  PAIN:  Are you having pain? No  FALLS: Has patient fallen in last 6 months? No  LIVING ENVIRONMENT: Lives with: lives with their family Lives in: House/apartment Has following equipment at home: Wheelchair (power), Wheelchair (manual), Shower bench, bed side commode, Grab bars, and Ramped entry  PLOF: Needs assistance with ADLs  PATIENT GOALS: To increase strength  OBJECTIVE:  Note: Objective measures were completed at Evaluation unless otherwise noted.  HAND DOMINANCE: Right  ADLs: Overall ADLs: Pt having weakness, limiting his ability to self transfer and self propel his wheel chair  MOBILITY STATUS: Pt is WC bound, has self propelled wc in the past and complete independent transfers.   ACTIVITY TOLERANCE: Activity tolerance: Good  FUNCTIONAL OUTCOME MEASURES: Upper Extremity Functional Scale (UEFS): 65/80   Extreme difficulty/unable (0), Quite a bit of difficulty (1),  Moderate difficulty (2), Little difficulty (3), No difficulty (4) Survey date:  03/13/24  Any of your usual work, household or school activities 4  2. Your usual hobbies, recreational/sport activities 4   3. Lifting a bag of groceries to waist level 4   4. Lifting a bag of groceries above your head 3  5. Grooming your hair 1  6. Pushing up on your hands (I.e. from  bathtub or chair) 3  7. Preparing food (I.e. peeling/cutting) 3  8. Driving  0  9. Vacuuming, sweeping, or raking 3  10. Dressing  3  11. Doing up buttons 4  12. Using tools/appliances 2  13. Opening doors 4  14. Cleaning  4  15. Tying or lacing shoes 4  16. Sleeping  4  17. Laundering clothes (I.e. washing, ironing, folding) 3  18. Opening a jar 4  19. Throwing a ball 4  20. Carrying a small suitcase with your affected limb.  4  Score total:  65/80     UPPER EXTREMITY ROM:    All ROM is WFL, except L elbow is contracted at 146* extension  UPPER EXTREMITY MMT:     MMT Right eval Left eval  Shoulder flexion 5/5 5/5  Shoulder abduction 5/5 5/5  Shoulder internal rotation 5/5 5/5  Shoulder external rotation 5/5 5/5  Elbow flexion 4+/5 4+/5  Elbow extension 4+/5 4/5  Wrist flexion 5/5 4+/5  Wrist extension 5/5 4+/5  Wrist ulnar deviation 5/5 4+/5  Wrist radial deviation 5/5 5/5  Wrist pronation 5/5 5/5  Wrist supination 5/5 4+/5  (Blank rows = not tested)  HAND FUNCTION: Grip strength: Right: 82 lbs; Left: 68 lbs, Lateral pinch: Right: 15 lbs, Left: 14 lbs, and 3 point pinch: Right: 17 lbs, Left: 13 lbs  SENSATION: WFL  EDEMA: No swelling noted  OBSERVATIONS: No Concerns                                                                                                                             TREATMENT DATE: Evaluation Only      PATIENT EDUCATION: Education details: Evaluation Only Person educated: Patient Education method: Medical Illustrator Education comprehension: verbalized understanding and returned demonstration  HOME EXERCISE PROGRAM: Evaluation Only   ASSESSMENT:  CLINICAL IMPRESSION: Patient is a 18 y.o. male who was seen today for occupational therapy evaluation for weakness following a hospitalization. He has spina bifida and with a recent pressure ulcer and hospitalization he became weak and had a decrease in ADL abilities. At  this time, pt demonstrating improved strength and mobility, he is near his baseline and has no further skilled OT needs at this time. SABRA   PERFORMANCE DEFICITS: in functional skills including ADLs and IADLs, cognitive skills including problem solving, and psychosocial skills including environmental adaptation and routines and behaviors.   IMPAIRMENTS: are limiting patient from ADLs.   CO-MORBIDITIES: may have co-morbidities  that affects occupational performance. Patient will benefit from skilled  OT to address above impairments and improve overall function.  MODIFICATION OR ASSISTANCE TO COMPLETE EVALUATION: Min-Moderate modification of tasks or assist with assess necessary to complete an evaluation.  OT OCCUPATIONAL PROFILE AND HISTORY: Detailed assessment: Review of records and additional review of physical, cognitive, psychosocial history related to current functional performance.  CLINICAL DECISION MAKING: Moderate - several treatment options, min-mod task modification necessary  REHAB POTENTIAL: Good  EVALUATION COMPLEXITY: Moderate    PLAN:  OT FREQUENCY: one time visit  OT DURATION: other: Evaluation Only  PLANNED INTERVENTIONS: 02831 OT Re-evaluation  RECOMMENDED OTHER SERVICES: N/A  CONSULTED AND AGREED WITH PLAN OF CARE: Patient  PLAN FOR NEXT SESSION: Discharge   Valentin Nightingale, OTR/L [facility] [phone] Sherry Rogus Jillyn Nightingale, OT 03/15/2024, 12:34 PM

## 2024-03-13 NOTE — Patient Instructions (Signed)
AROM Exercises   1) Wrist Flexion  Start with wrist at edge of table, palm facing up. With wrist hanging slightly off table, curl wrist upward, and back down.      2) Wrist Extension  Start with wrist at edge of table, palm facing down. With wrist slightly off the edge of the table, curl wrist up and back down.      3) Radial Deviations  Start with forearm flat against a table, wrist hanging slightly off the edge, and palm facing the wall. Bending at the wrist only, and keeping palm facing the wall, bend wrist so fist is pointing towards the floor, back up to start position, and up towards the ceiling. Return to start.        4) WRIST PRONATION  Turn your forearm towards palm face down.  Keep your elbow bent and by the side of your  Body.      5) WRIST SUPINATION  Turn your forearm towards palm face up.  Keep your elbow bent and by the side of your  Body.      *Complete exercises ______ times each, _______ times per day*      Strengthening Exercises  1) WRIST EXTENSION CURLS - TABLE  Hold a small free weight, rest your forearm on a table and bend your wrist up and down with your palm face down as shown.      2) WRIST FLEXION CURLS - TABLE  Hold a small free weight, rest your forearm on a table and bend your wrist up and down with your palm face up as shown.     3) FREE WEIGHT RADIAL/ULNAR DEVIATION - TABLE  Hold a small free weight, rest your forearm on a table and bend your wrist up and down with your palm facing towards the side as shown.     4) Pronation  Forearm supported on table with wrist in neutral position. Using a weight, roll wrist so that palm faces downward. Hold for 2 seconds and return to starting position.     5) Supination  Forearm supported on table with wrist in neutral position. Using a weight, roll wrist so that palm is now facing upward. Hold for 2 seconds and return to starting position.      *Complete  exercises using ____ pound weight, ____times each, ____times per day*
